# Patient Record
Sex: Female | Born: 1965
Health system: Southern US, Community
[De-identification: ages and names within clinical notes are randomized; demographics above are authoritative.]

## PROBLEM LIST (undated history)

## (undated) DIAGNOSIS — I10 Essential (primary) hypertension: Secondary | ICD-10-CM

## (undated) DIAGNOSIS — K219 Gastro-esophageal reflux disease without esophagitis: Secondary | ICD-10-CM

## (undated) HISTORY — PX: BREAST BIOPSY: SHX20

---

## 2016-09-23 HISTORY — PX: CHOLECYSTECTOMY: SHX55

## 2018-10-19 ENCOUNTER — Emergency Department (HOSPITAL_COMMUNITY)
Admission: EM | Admit: 2018-10-19 | Discharge: 2018-10-19 | Disposition: A | Discharge status: Home / Self Care | Payer: Self-pay | Attending: Emergency Medicine | Admitting: Emergency Medicine

## 2018-10-19 ENCOUNTER — Other Ambulatory Visit: Payer: Self-pay

## 2018-10-19 ENCOUNTER — Encounter (HOSPITAL_COMMUNITY): Payer: Self-pay | Admitting: Emergency Medicine

## 2018-10-19 DIAGNOSIS — I10 Essential (primary) hypertension: Secondary | ICD-10-CM | POA: Insufficient documentation

## 2018-10-19 DIAGNOSIS — N939 Abnormal uterine and vaginal bleeding, unspecified: Secondary | ICD-10-CM | POA: Insufficient documentation

## 2018-10-19 HISTORY — DX: Essential (primary) hypertension: I10

## 2018-10-19 LAB — CBC
HCT: 41.9 % (ref 36.0–46.0)
Hemoglobin: 13.7 g/dL (ref 12.0–15.0)
MCH: 30.2 pg (ref 26.0–34.0)
MCHC: 32.7 g/dL (ref 30.0–36.0)
MCV: 92.3 fL (ref 80.0–100.0)
Platelets: 321 10*3/uL (ref 150–400)
RBC: 4.54 MIL/uL (ref 3.87–5.11)
RDW: 12.6 % (ref 11.5–15.5)
WBC: 8.1 10*3/uL (ref 4.0–10.5)
nRBC: 0 % (ref 0.0–0.2)

## 2018-10-19 LAB — COMPREHENSIVE METABOLIC PANEL
ALT: 17 U/L (ref 0–44)
AST: 21 U/L (ref 15–41)
Albumin: 4 g/dL (ref 3.5–5.0)
Alkaline Phosphatase: 65 U/L (ref 38–126)
Anion gap: 11 (ref 5–15)
BUN: 13 mg/dL (ref 6–20)
CO2: 24 mmol/L (ref 22–32)
Calcium: 9 mg/dL (ref 8.9–10.3)
Chloride: 103 mmol/L (ref 98–111)
Creatinine, Ser: 0.59 mg/dL (ref 0.44–1.00)
GFR calc Af Amer: >60 mL/min (ref >60)
GFR calc non Af Amer: >60 mL/min (ref >60)
Glucose, Bld: 95 mg/dL (ref 70–99)
Potassium: 4.1 mmol/L (ref 3.5–5.1)
Sodium: 138 mmol/L (ref 135–145)
Total Bilirubin: 0.6 mg/dL (ref 0.3–1.2)
Total Protein: 7 g/dL (ref 6.5–8.1)

## 2018-10-19 LAB — I-STAT BETA HCG BLOOD, ED (MC, WL, AP ONLY): I-stat hCG, quantitative: <5 m[IU]/mL (ref <5)

## 2018-10-19 NOTE — ED Triage Notes (Signed)
Pt states she started having vaginal bleeding 1 month ago but not constantly pt is not currently having bleeding but states she has pain in her vaginal. Pt denies any abd pain or n/v.

## 2018-10-19 NOTE — ED Notes (Signed)
Patient verbalizes understanding of discharge instructions. Opportunity for questioning and answers were provided. Pt discharged from ED. 

## 2018-10-20 NOTE — ED Provider Notes (Signed)
[facility] EMERGENCY DEPARTMENT Provider Note   CSN: [id number] Arrival date & time: 10/19/18  1247     History   Chief Complaint Chief Complaint  Patient presents with  . Vaginal Bleeding    HPI Kelly Prince is a 53 y.o. female.     53 year old female with history of hypertension who presents with vaginal bleeding.  Patient states that she has had intermittent problems with vaginal bleeding over the past year.  She does not have constant bleeding, is not currently bleeding but recently has had more problems with vaginal pain and suprapubic discomfort.  No urinary problems, nausea, vomiting, diarrhea, fevers, or vaginal discharge.  SHe last saw an OB/GYN over a year ago.  The history is provided by the patient.  Vaginal Bleeding   Past Medical History:  Diagnosis Date  . Hypertension     There are no active problems to display for this patient.   History reviewed. No pertinent surgical history.   OB History   No obstetric history on file.      Home Medications    Prior to Admission medications   Medication Sig Start Date End Date Taking? Authorizing Provider  lisinopril (ZESTRIL) 20 MG tablet Take 20 mg by mouth daily.   Yes [provider]    Family History No family history on file.  Social History Social History   Tobacco Use  . Smoking status: Not on file  Substance Use Topics  . Alcohol use: Not on file  . Drug use: Not on file     Allergies   Patient has no known allergies.   Review of Systems Review of Systems  Genitourinary: Positive for vaginal bleeding.   All other systems reviewed and are negative except that which was mentioned in HPI   Physical Exam Updated Vital Signs BP 114/70   Pulse 65   Temp 98.4 F (36.9 C) (Oral)   Resp 16   LMP 09/24/2018   SpO2 100%   Physical Exam Vitals signs and nursing note reviewed. Exam conducted with a chaperone present.  Constitutional:      General: She  is not in acute distress.    Appearance: She is well-developed.  HENT:     Head: Normocephalic and atraumatic.  Eyes:     Conjunctiva/sclera: Conjunctivae normal.  Neck:     Musculoskeletal: Neck supple.  Cardiovascular:     Rate and Rhythm: Normal rate and regular rhythm.     Heart sounds: Normal heart sounds. No murmur.  Pulmonary:     Effort: Pulmonary effort is normal.     Breath sounds: Normal breath sounds.  Abdominal:     General: Bowel sounds are normal. There is no distension.     Palpations: Abdomen is soft.     Tenderness: There is no abdominal tenderness.  Genitourinary:    Vagina: No vaginal discharge.     Comments: No blood or discharge in vaginal vault; mass protruding from cervical os, mildly tender on bimanual exam; no adnexal tenderness Skin:    General: Skin is warm and dry.  Neurological:     Mental Status: She is alert and oriented to person, place, and time.     Comments: Fluent speech  Psychiatric:        Judgment: Judgment normal.      ED Treatments / Results  Labs (all labs ordered are listed, but only abnormal results are displayed) Labs Reviewed  COMPREHENSIVE METABOLIC PANEL  CBC  I-STAT BETA HCG  BLOOD, ED ([facility], [facility], [facility] ONLY)  GC/CHLAMYDIA PROBE AMP ([place]) NOT AT [facility]    EKG None  [facility] No results found.  Procedures Procedures (including critical care time)  Medications Ordered in ED Medications - No data to display   Initial Impression / Assessment and Plan / ED Course  I have reviewed the triage vital signs and the nursing notes.  Pertinent labs & imaging results that were available during my care of the patient were reviewed by me and considered in my medical decision making (see chart for details).       On pelvic exam she did not have any bleeding or discharge to suggest infection but she did have a small mass protruding from cervix.  It is possible that this is uterine prolapse although differential includes  tumor.Her lab work today is reassuring with normal hemoglobin.  I have emphasized the importance of close OB/GYN follow-up for further investigation.  Reviewed return precautions.  Patient voiced understanding.  Interpreter used during interview. Final Clinical Impressions(s) / ED Diagnoses   Final diagnoses:  Abnormal uterine bleeding    ED Discharge Orders    None       , Ambrose Finlandachel Morgan, MD 10/20/18 [phone]

## 2018-10-21 LAB — GC/CHLAMYDIA PROBE AMP (~~LOC~~) NOT AT ARMC
Chlamydia: NEGATIVE
Neisseria Gonorrhea: NEGATIVE

## 2018-11-30 ENCOUNTER — Ambulatory Visit (INDEPENDENT_AMBULATORY_CARE_PROVIDER_SITE_OTHER): Payer: Self-pay | Admitting: Obstetrics and Gynecology

## 2018-11-30 ENCOUNTER — Encounter: Payer: Self-pay | Admitting: Obstetrics and Gynecology

## 2018-11-30 ENCOUNTER — Other Ambulatory Visit (HOSPITAL_COMMUNITY)
Admission: RE | Admit: 2018-11-30 | Discharge: 2018-11-30 | Disposition: A | Discharge status: Home / Self Care | Payer: Self-pay | Source: Ambulatory Visit | Attending: Obstetrics and Gynecology | Admitting: Obstetrics and Gynecology

## 2018-11-30 ENCOUNTER — Other Ambulatory Visit: Payer: Self-pay

## 2018-11-30 VITALS — BP 166/83 | HR 75 | Wt 190.8 lb

## 2018-11-30 DIAGNOSIS — Z124 Encounter for screening for malignant neoplasm of cervix: Secondary | ICD-10-CM

## 2018-11-30 DIAGNOSIS — Z113 Encounter for screening for infections with a predominantly sexual mode of transmission: Secondary | ICD-10-CM

## 2018-11-30 DIAGNOSIS — Z789 Other specified health status: Secondary | ICD-10-CM

## 2018-11-30 DIAGNOSIS — N841 Polyp of cervix uteri: Secondary | ICD-10-CM | POA: Insufficient documentation

## 2018-11-30 DIAGNOSIS — Z1151 Encounter for screening for human papillomavirus (HPV): Secondary | ICD-10-CM

## 2018-11-30 DIAGNOSIS — E669 Obesity, unspecified: Secondary | ICD-10-CM | POA: Insufficient documentation

## 2018-11-30 DIAGNOSIS — N950 Postmenopausal bleeding: Secondary | ICD-10-CM | POA: Insufficient documentation

## 2018-11-30 NOTE — Progress Notes (Signed)
Spanish Interpreter Eda R.,  

## 2018-11-30 NOTE — Progress Notes (Signed)
Obstetrics and Gynecology New Patient Evaluation  Appointment Date: 11/30/2018  OBGYN Clinic: [facility] for [facility]  Primary Care Provider: Patient, No Pcp Per  Referring Provider: Emergency Department  Chief Complaint: postmenopausal bleeding  History of Present Illness: Kelly Prince is a 53 y.o. Hispanic [id number] (No LMP recorded.), seen for the above chief complaint.   Patient went to ED on 8/26 for VB. Patient states it started about a week prior and LMP was 7 years prior to this with no bleeding in between. No pain and no bleeding every day  In the ED they noted something on the cervix and she had a negative cmp, cbc, beta hcg and gc/ct  States having some bleeding today. No GI s/s  Review of Systems:  as noted in the History of Present Illness.  Past Medical History:  Past Medical History:  Diagnosis Date  . Hypertension     Past Surgical History:  Past Surgical History:  Procedure Laterality Date  . CHOLECYSTECTOMY      Past Obstetrical History:  OB History  Gravida Para Term Preterm AB Living  2 2 2     2   SAB TAB Ectopic Multiple Live Births               # Outcome Date GA Lbr Len/2nd Weight Sex Delivery Anes PTL Lv  2 Term           1 Term             Obstetric Comments  SVD x 2    Past Gynecological History: As per HPI.  Social History:  Social History   Socioeconomic History  . Marital status: Single    Spouse name: Not on file  . Number of children: Not on file  . Years of education: Not on file  . Highest education level: Not on file  Occupational History  . Not on file  Social Needs  . Financial resource strain: Not on file  . Food insecurity    Worry: Not on file    Inability: Not on file  . Transportation needs    Medical: Not on file    Non-medical: Not on file  Tobacco Use  . Smoking status: Never Smoker  . Smokeless tobacco: Never Used  Substance and Sexual Activity  . Alcohol use: Never    Frequency: Never   . Drug use: Never  . Sexual activity: Not Currently  Lifestyle  . Physical activity    Days per week: Not on file    Minutes per session: Not on file  . Stress: Not on file  Relationships  . Social Herbalist on phone: Not on file    Gets together: Not on file    Attends religious service: Not on file    Active member of club or organization: Not on file    Attends meetings of clubs or organizations: Not on file    Relationship status: Not on file  . Intimate partner violence    Fear of current or ex partner: Not on file    Emotionally abused: Not on file    Physically abused: Not on file    Forced sexual activity: Not on file  Other Topics Concern  . Not on file  Social History Narrative  . Not on file    Family History: No family history on file.   Medications Amaree Loisel had no medications administered during this visit. Current Outpatient Medications  Medication Sig Dispense Refill  .  lisinopril (ZESTRIL) 20 MG tablet Take 20 mg by mouth daily.     No current facility-administered medications for this visit.     Allergies Patient has no known allergies.   Physical Exam:  BP (!) 166/83   Pulse 75   Wt 190 lb 12.8 oz (86.5 kg)  There is no height or weight on file to calculate BMI. General appearance: Well nourished, well developed female in no acute distress.  Respiratory:  Normal respiratory effort Abdomen: positive bowel sounds and no masses, hernias; diffusely non tender to palpation, non distended Neuro/Psych:  Normal mood and affect.  Skin:  Warm and dry.  Lymphatic:  No inguinal lymphadenopathy.   Pelvic exam: is not limited by body habitus EGBUS: within normal limits, mild atrophy Vagina: within normal limits, mild atrophy, and with scant old blood blood in the vault, no discharge Cervix: there is an approximately at least 3cm in length endocervical polyp x 1.5cm in width that extends into the canal (appears to move as if pedunculated),  moderately friable. Surrounding cervix is normal. Uterus:  nonenlarged and non tender and Adnexa:  normal adnexa and no mass, fullness, tenderness Rectovaginal: deferred  See procedure note for pap, embx and polyp biopsy  Laboratory: as per HPI  Radiology: none  Assessment: pt stable  Plan:  I d/w her that the polyp appears benign and is most likely the cause of her bleeding but still recommend standard PMB work up which she is amenable to. U/s will give a better idea on size of polyp and if feasible to remove in the office.   Interpreter used  RTC after u/s  Cornelia Copa MD Attending Center for [facility])

## 2018-12-01 ENCOUNTER — Ambulatory Visit (HOSPITAL_COMMUNITY): Payer: Self-pay

## 2018-12-01 NOTE — Procedures (Addendum)
Pap Smear, Endometrial Biopsy, and Cervical Polyp Biopsy Procedure Note  Pre-operative Diagnosis: PMB  Post-operative Diagnosis: PMB. Endocervical polyp  Procedure Details  The risks (including infection, bleeding, pain, and uterine perforation) and benefits of the procedure were explained to the patient and Written informed consent was obtained.  The patient was placed in the dorsal lithotomy position.  A Graves' speculum inserted in the vagina, and the cervix was visualized. There is an approximately at least 3cm in length endocervical polyp x 1.5cm in width that extends into the canal (appears to move as if pedunculated), moderately friable. Surrounding cervix is normal.  A pap smear was done.    The cervix was then prepped with povidone iodine, and a sharp tenaculum was applied to the anterior lip of the cervix for stabilization.  A pipelle was inserted into the uterine cavity and sounded the uterus only to a depth of 6.5cm in multiple places.  A scant amount of tissue was collected after 2 passes.   Tischler forceps were used to take a biopsy from the polyp and silver nitrate then applied  The samples were sent for pathologic examination.  Condition: Stable  Complications: None  Plan: EMBx done but I don't think I got intrauterine, but I didn't want to try and dilate given the polyp. Follow up u/s and embx results.  She was advised to be on pelvic rest until the polyp is fully dealt with.  Durene Romans MD Attending Center for [employer])

## 2018-12-02 LAB — SURGICAL PATHOLOGY

## 2018-12-07 ENCOUNTER — Ambulatory Visit (HOSPITAL_COMMUNITY)
Admission: RE | Admit: 2018-12-07 | Discharge: 2018-12-07 | Disposition: A | Discharge status: Home / Self Care | Payer: Self-pay | Source: Ambulatory Visit | Attending: Obstetrics and Gynecology | Admitting: Obstetrics and Gynecology

## 2018-12-07 ENCOUNTER — Other Ambulatory Visit: Payer: Self-pay

## 2018-12-07 DIAGNOSIS — N950 Postmenopausal bleeding: Secondary | ICD-10-CM | POA: Insufficient documentation

## 2018-12-12 LAB — CYTOLOGY - PAP
Comment: NEGATIVE
Comment: NEGATIVE
Comment: NEGATIVE
Diagnosis: NEGATIVE
HPV 16: NEGATIVE
HPV 18 / 45: NEGATIVE
High risk HPV: POSITIVE — AB
Trichomonas: NEGATIVE

## 2018-12-15 ENCOUNTER — Telehealth: Payer: Self-pay | Admitting: *Deleted

## 2018-12-15 NOTE — Telephone Encounter (Signed)
Siana called and left a voice message she had an ultrasound 2-3 weeks ago and was supposed to get a call from the doctor. Wants to know the results. I called Bertie and informed her she has a fu appt 01/11/19 and the doctor will discuss in detail. I did inform her it showed thickened endometrium and recommends endometrial biopsy. She states she already had the endometrial biopsy . She wants Dr. Ilda Basset to let her know asap if he is going to be able to remove the polyp in the office or if it will be  Surgery in the hospital  because she is wanting to go back to [place].  I informed her I can forward to Dr. Ilda Basset and we will get back to  Her once we hear from him. She voices understanding. Marri Mcneff,RN

## 2018-12-19 ENCOUNTER — Telehealth: Payer: Self-pay | Admitting: Obstetrics and Gynecology

## 2018-12-19 NOTE — Telephone Encounter (Signed)
GYN Telephone Note Patient called at [phone] and d/w her re: ultrasound findings and recommend removal in the OR given it's size. Pt is moving to [place] but would like to have the surgery here. D/w her re: outpatient nature of it and plan for hysteroscopy, myosure polypectomy with large blade and d&c.   She does have questions re: cost and scheduling can d/w her re: this.  Preferred phone number [phone]  Phone interpreter used  Aletha Halim, Brooke Bonito MD Attending Center for [employer] (Faculty Practice) 12/19/2018 Time: 1249pm

## 2018-12-21 NOTE — Telephone Encounter (Signed)
Per chart review Dr.Pickens did call Alyx and address her questions. ,RN

## 2018-12-23 ENCOUNTER — Encounter: Payer: Self-pay | Admitting: Obstetrics and Gynecology

## 2018-12-23 DIAGNOSIS — B977 Papillomavirus as the cause of diseases classified elsewhere: Secondary | ICD-10-CM | POA: Insufficient documentation

## 2019-01-10 ENCOUNTER — Telehealth: Payer: Self-pay | Admitting: Obstetrics and Gynecology

## 2019-01-10 NOTE — Telephone Encounter (Signed)
Spanish interpreter Eda attempted to call patient about her appointment on 11/18 @ 10:35. No answer, Eda left a voicemail instructing patient to wear a face mask for the entire appointment and no visitors are allowed. Patient instructed not to attend the appointment if she has any symptoms. Symptom list and office number left.

## 2019-01-11 ENCOUNTER — Ambulatory Visit (INDEPENDENT_AMBULATORY_CARE_PROVIDER_SITE_OTHER): Payer: Self-pay | Admitting: Obstetrics and Gynecology

## 2019-01-11 ENCOUNTER — Other Ambulatory Visit: Payer: Self-pay

## 2019-01-11 VITALS — BP 185/81 | HR 61 | Wt 189.3 lb

## 2019-01-11 DIAGNOSIS — I10 Essential (primary) hypertension: Secondary | ICD-10-CM

## 2019-01-11 DIAGNOSIS — N841 Polyp of cervix uteri: Secondary | ICD-10-CM

## 2019-01-11 DIAGNOSIS — Z789 Other specified health status: Secondary | ICD-10-CM

## 2019-01-11 NOTE — Progress Notes (Signed)
Spanish Interpreter Eda R. 

## 2019-01-12 ENCOUNTER — Encounter (HOSPITAL_BASED_OUTPATIENT_CLINIC_OR_DEPARTMENT_OTHER): Payer: Self-pay

## 2019-01-12 ENCOUNTER — Other Ambulatory Visit: Payer: Self-pay | Admitting: Obstetrics and Gynecology

## 2019-01-12 ENCOUNTER — Other Ambulatory Visit: Payer: Self-pay

## 2019-01-12 DIAGNOSIS — I10 Essential (primary) hypertension: Secondary | ICD-10-CM | POA: Insufficient documentation

## 2019-01-12 LAB — COMPREHENSIVE METABOLIC PANEL
ALT: 21 IU/L (ref 0–32)
AST: 24 IU/L (ref 0–40)
Albumin/Globulin Ratio: 2 (ref 1.2–2.2)
Albumin: 4.9 g/dL (ref 3.8–4.9)
Alkaline Phosphatase: 79 IU/L (ref 39–117)
BUN/Creatinine Ratio: 20 (ref 9–23)
BUN: 12 mg/dL (ref 6–24)
Bilirubin Total: 0.6 mg/dL (ref 0.0–1.2)
CO2: 24 mmol/L (ref 20–29)
Calcium: 9.6 mg/dL (ref 8.7–10.2)
Chloride: 101 mmol/L (ref 96–106)
Creatinine, Ser: 0.59 mg/dL (ref 0.57–1.00)
GFR calc Af Amer: 119 mL/min/{1.73_m2} (ref >59)
GFR calc non Af Amer: 104 mL/min/{1.73_m2} (ref >59)
Globulin, Total: 2.5 g/dL (ref 1.5–4.5)
Glucose: 92 mg/dL (ref 65–99)
Potassium: 4.6 mmol/L (ref 3.5–5.2)
Sodium: 140 mmol/L (ref 134–144)
Total Protein: 7.4 g/dL (ref 6.0–8.5)

## 2019-01-12 LAB — CBC
Hematocrit: 41.8 % (ref 34.0–46.6)
Hemoglobin: 14.4 g/dL (ref 11.1–15.9)
MCH: 30.6 pg (ref 26.6–33.0)
MCHC: 34.4 g/dL (ref 31.5–35.7)
MCV: 89 fL (ref 79–97)
Platelets: 319 10*3/uL (ref 150–450)
RBC: 4.7 x10E6/uL (ref 3.77–5.28)
RDW: 12.5 % (ref 11.7–15.4)
WBC: 7.4 10*3/uL (ref 3.4–10.8)

## 2019-01-12 LAB — T4, FREE: Free T4: 1.04 ng/dL (ref 0.82–1.77)

## 2019-01-12 LAB — MAGNESIUM: Magnesium: 2.4 mg/dL — ABNORMAL HIGH (ref 1.6–2.3)

## 2019-01-12 LAB — TSH: TSH: 2.77 u[IU]/mL (ref 0.450–4.500)

## 2019-01-12 NOTE — Progress Notes (Signed)
Obstetrics and Gynecology Visit Return Patient Evaluation  Appointment Date: 01/11/2019  Primary Care Provider: Patient, No Pcp Per  OBGYN Clinic: [facility]  Chief Complaint: f/u cervical polyp  History of Present Illness:  Kelly Prince is a 53 y.o. with above CC.  Patient set up for 11/25 hysteroscopy, d&c and myosure polypectomy.  She states she didn't take her lisinopril this morning. Not having any VB or spotting and no chest pain, sob, ha or other neuro s/s.    Review of Systems: as noted in the History of Present Illness.  Medications:  Mairlyn Tegtmeyer had no medications administered during this visit. Current Outpatient Medications  Medication Sig Dispense Refill  . lisinopril (ZESTRIL) 20 MG tablet Take 20 mg by mouth daily.     No current facility-administered medications for this visit.     Allergies: has No Known Allergies.  Physical Exam:  BP (!) 185/81   Pulse 61   Wt 189 lb 4.8 oz (85.9 kg)   LMP 09/24/2018  There is no height or weight on file to calculate BMI. General appearance: Well nourished, well developed female in no acute distress.  Neuro/Psych:  Normal mood and affect.    Assessment: pt with asymptomatic htn  Plan:  1. Hypertension, unspecified type Pt told to take today and continue qday and RTC for bp check in 2 days. If still really elevated, I told her that will have to talk to anesthesia and may have to cancel surgery; pt states her PCP is in [place] - CBC - Magnesium - TSH - T4, free - CMP  2. Language barrier Interpreter used  3. Cervical polyp See above.    RTC: 2 days for rn bp check  Durene Romans MD Attending Center for [facility])

## 2019-01-13 ENCOUNTER — Other Ambulatory Visit: Payer: Self-pay | Admitting: Obstetrics and Gynecology

## 2019-01-13 ENCOUNTER — Ambulatory Visit (INDEPENDENT_AMBULATORY_CARE_PROVIDER_SITE_OTHER): Payer: Self-pay

## 2019-01-13 ENCOUNTER — Telehealth: Payer: Self-pay

## 2019-01-13 VITALS — BP 178/93 | HR 62 | Wt 190.2 lb

## 2019-01-13 DIAGNOSIS — Z0130 Encounter for examination of blood pressure without abnormal findings: Secondary | ICD-10-CM

## 2019-01-13 NOTE — Telephone Encounter (Signed)
-----   Message from Aletha Halim, MD sent at 01/13/2019 11:26 AM EST ----- Regarding: please let her know that we have to cancel her surgery next week b/c her BPs need to be better controlled by her pcp thanks

## 2019-01-13 NOTE — Telephone Encounter (Signed)
Spoke to Kelly Prince vis interpreter (interpreter ID [phone]), informed her that her surgery has been cancelled and advised her to follow up with a pcp for her blood pressure. Confirmed that she had the phone number for a pcp, she verified, advised her to call us back if she had any questions.

## 2019-01-13 NOTE — Progress Notes (Signed)
Pt here today for BP check following appt on 12/11/18 with Ilda Basset, MD. Pt reported not taking Lisinopril on day of visit and had elevated BP of 185/81. Pt was scheduled for surgery but needed follow up on BP.  Pt states she took Lisinopril this AM. BP today is 178/93. BP reviewed with Ilda Basset, MD who states he will discuss with anesthesia before moving forward with surgery.   Pt notified of conversation with Ilda Basset, MD. Explained to pt that we will contact her to let her know if she will be able to have the surgery or if it will be canceled. Pt states she last saw her PCP in [place], [place] in November of 2019. Pt states she does not plan to return. Information given to pt about Primary Care at [facility] and encouraged to make a new pt appt so that her BP can be managed. Educated pt about need to go to urgent care if she is experiencing any headache, blurry vision, or dizziness and has not begun care with PCP. Pt verbalizes understanding.  Apolonio Schneiders RN 01/13/19

## 2019-01-14 ENCOUNTER — Other Ambulatory Visit (HOSPITAL_COMMUNITY): Payer: Self-pay

## 2019-01-17 NOTE — Progress Notes (Signed)
Patient seen and assessed by nursing staff during this encounter. I have reviewed the chart and agree with the documentation and plan.  Brileigh Sevcik, MD 01/17/2019 12:18 AM    

## 2019-01-18 ENCOUNTER — Ambulatory Visit (HOSPITAL_BASED_OUTPATIENT_CLINIC_OR_DEPARTMENT_OTHER): Admit: 2019-01-18 | Payer: Self-pay | Admitting: Obstetrics and Gynecology

## 2019-01-18 SURGERY — DILATATION & CURETTAGE/HYSTEROSCOPY WITH MYOSURE
Anesthesia: Choice

## 2019-01-23 ENCOUNTER — Telehealth (INDEPENDENT_AMBULATORY_CARE_PROVIDER_SITE_OTHER): Payer: Self-pay | Admitting: Lactation Services

## 2019-01-23 ENCOUNTER — Telehealth: Payer: Self-pay

## 2019-01-23 DIAGNOSIS — B977 Papillomavirus as the cause of diseases classified elsewhere: Secondary | ICD-10-CM

## 2019-01-23 NOTE — Telephone Encounter (Signed)
-----   Message from Aletha Halim, MD sent at 01/23/2019  8:52 AM EST ----- Can you let her know that she'll need a pap smear in one year? thanks

## 2019-01-23 NOTE — Telephone Encounter (Signed)
Called to speak with pt with assistance of [employer] # [phone].   Lm on patients home phone that office is calling with results and to call the office back at [phone].   Called pt on the mobile phone # in the chart and spoke with [facility]. Results of + HPV and biopsy given with recommendation to follow up with a repeat Pap Smear in 1 year.   Pt with multiple questions about HPV that were answered. Discussed it is a common virus and the infection often clears on its own.

## 2019-01-23 NOTE — Telephone Encounter (Signed)
Called patient to do their pre-visit COVID screening. Number listed is daughter  Have you tested positive for COVID or are you currently waiting for COVID test results? no  Have you recently traveled internationally([place], [place], [place], [place], [place]) or within the [place] to a hotspot area([place], [place], [place], [place], [place])? no  Are you currently experiencing any of the following symptoms: fever, cough, SHOB, fatigue, body aches, loss of smell/taste, rash, diarrhea, vomiting, severe headaches, weakness, sore throat? no  Have you been in contact with anyone who has recently travelled? no  Have you been in contact with anyone who is experiencing any of the above symptoms or been diagnosed with COVID  or works in or has recently visited a SNF? no

## 2019-01-24 ENCOUNTER — Ambulatory Visit (INDEPENDENT_AMBULATORY_CARE_PROVIDER_SITE_OTHER): Payer: Self-pay | Admitting: Internal Medicine

## 2019-01-24 ENCOUNTER — Other Ambulatory Visit: Payer: Self-pay

## 2019-01-24 VITALS — BP 144/88 | HR 62 | Temp 98.2°F | Resp 17 | Ht 65.0 in | Wt 189.4 lb

## 2019-01-24 DIAGNOSIS — I10 Essential (primary) hypertension: Secondary | ICD-10-CM

## 2019-01-24 DIAGNOSIS — Z01818 Encounter for other preprocedural examination: Secondary | ICD-10-CM

## 2019-01-24 MED ORDER — AMLODIPINE BESYLATE 5 MG PO TABS: 5.0000 mg | ORAL_TABLET | Freq: Every day | ORAL | 3 refills | Status: AC

## 2019-01-24 MED ORDER — LISINOPRIL 20 MG PO TABS: 20.0000 mg | ORAL_TABLET | Freq: Every day | ORAL | 3 refills | Status: AC

## 2019-01-24 MED FILL — LISINOPRIL 20 MG TABLET: 20 | 30 days supply | Qty: 30 | Fill #0

## 2019-01-24 MED FILL — AMLODIPINE BESYLATE 5 MG TA: 5 | 30 days supply | Qty: 30 | Fill #0

## 2019-01-24 NOTE — Progress Notes (Signed)
Patient ID: Kelly RadarMarisol Prince, female    DOB: [birth year]  MRN: [id number]  CC: Establish Care and Hypertension   Subjective: Kelly Prince is a 53 y.o. female who presents for new patient visit and management of hypertension.  Interpreter, Wallene HuhMarta, interpreter from [place], is with her.  Her concerns today include:  Patient with history of HTN, obesity, hearing impaired on RT (hearing aid)  [place] relocated from [place] 3.5 mths.  Last saw doctor there 1 yr ago.   Pt was scheduled to have hysteroscopy, D&C and MyoSure polypectomy by gynecology on 01/18/2019.  However surgery was canceled due to elevated blood pressure.  Patient advised to be seen by a primary care physician to address prior to surgery.  Patient diagnosed with HTN 7 years ago - Currently on Lisinopril 20 mg x 7 yrs.  No device to check BP Endorses some HA and little dizziness when she looks down at her cell phone.  She has noticed this for the past 5 days.  Denies any lower extremity edema.  She tries to limit salt in the foods.  -She has had gallbladder surgery in the past.  No problems waking up from anesthesia.  Past medical, surgical, family history and social histories reviewed and updated.  Patient Active Problem List   Diagnosis Date Noted  . Hypertension 01/12/2019  . 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 Obesity 11/30/2018  . Postmenopausal bleeding 11/30/2018  . Language barrier 11/30/2018  . Cervical polyp 11/30/2018     No current outpatient medications on file prior to visit.   No current facility-administered medications on file prior to visit.     No Known Allergies  Social History   Socioeconomic History  . Marital status: Single    Spouse name: Not on file  . Number of children: 2  . Years of education: BA -accounting  . Highest education level: Not on file  Occupational History  . Occupation: unemployed  Social Needs  . Financial resource strain: Not on file  . Food insecurity    Worry: Not on file     Inability: Not on file  . Transportation needs    Medical: Not on file    Non-medical: Not on file  Tobacco Use  . Smoking status: Never Smoker  . Smokeless tobacco: Never Used  Substance and Sexual Activity  . Alcohol use: Never    Frequency: Never  . Drug use: Never  . Sexual activity: Not Currently  Lifestyle  . Physical activity    Days per week: Not on file    Minutes per session: Not on file  . Stress: Not on file  Relationships  . Social Musicianconnections    Talks on phone: Not on file    Gets together: Not on file    Attends religious service: Not on file    Active member of club or organization: Not on file    Attends meetings of clubs or organizations: Not on file    Relationship status: Not on file  . Intimate partner violence    Fear of current or ex partner: Not on file    Emotionally abused: Not on file    Physically abused: Not on file    Forced sexual activity: Not on file  Other Topics Concern  . Not on file  Social History Narrative  . Not on file    Family History  Problem Relation Age of Onset  . Hypertension Mother   . Thyroid disease Mother  Past Surgical History:  Procedure Laterality Date  . BREAST BIOPSY Right   . CHOLECYSTECTOMY  09/2016    ROS: Review of Systems Negative except as stated above  PHYSICAL EXAM: BP (!) 165/84   Pulse 62   Temp 98.2 F (36.8 C) (Temporal)   Resp 17   Ht 5\' 5"  (1.651 m)   Wt 189 lb 6.4 oz (85.9 kg)   LMP 09/24/2018   SpO2 99%   BMI 31.52 kg/m   Physical Exam Repeat blood pressure 144/88 General appearance - alert, well appearing, middle-age Hispanic female and in no distress Mental status - normal mood, behavior, speech, dress, motor activity, and thought processes Eyes - pupils equal and reactive, extraocular eye movements intact Nose - normal and patent, no erythema, discharge or polyps Mouth - mucous membranes moist, pharynx normal without lesions Neck - supple, no significant  adenopathy.  No cervical lymphadenopathy.  No thyroid enlargement or nodules. Lymphatics -no axillary lymphadenopathy  chest - clear to auscultation, no wheezes, rales or rhonchi, symmetric air entry Heart - normal rate, regular rhythm, normal S1, S2, no murmurs, rubs, clicks or gallops Extremities - peripheral pulses normal, no pedal edema, no clubbing or cyanosis   CMP Latest Ref Rng & Units 01/11/2019 10/19/2018  Glucose 65 - 99 mg/dL 92 95  BUN 6 - 24 mg/dL 12 13  Creatinine 10/21/2018 - 1.00 mg/dL 5.63 8.75  Sodium 6.43 - 144 mmol/L 140 138  Potassium 3.5 - 5.2 mmol/L 4.6 4.1  Chloride 96 - 106 mmol/L 101 103  CO2 20 - 29 mmol/L 24 24  Calcium 8.7 - 10.2 mg/dL 9.6 9.0  Total Protein 6.0 - 8.5 g/dL 7.4 7.0  Total Bilirubin 0.0 - 1.2 mg/dL 0.6 0.6  Alkaline Phos 39 - 117 IU/L 79 65  AST 0 - 40 IU/L 24 21  ALT 0 - 32 IU/L 21 17   Lipid Panel  No results found for: CHOL, TRIG, HDL, CHOLHDL, VLDL, LDLCALC, LDLDIRECT  CBC    Component Value Date/Time   WBC 7.4 01/11/2019 1051   WBC 8.1 10/19/2018 1320   RBC 4.70 01/11/2019 1051   RBC 4.54 10/19/2018 1320   HGB 14.4 01/11/2019 1051   HCT 41.8 01/11/2019 1051   PLT 319 01/11/2019 1051   MCV 89 01/11/2019 1051   MCH 30.6 01/11/2019 1051   MCH 30.2 10/19/2018 1320   MCHC 34.4 01/11/2019 1051   MCHC 32.7 10/19/2018 1320   RDW 12.5 01/11/2019 1051    ASSESSMENT AND PLAN: 1. Essential hypertension Blood pressure not at goal.  Goal being 130/80 or lower.  She will continue lisinopril.  We have added a low-dose of amlodipine that she will take once a day.  DASH diet discussed and encouraged.  Nurse only blood pressure check in 1 week.  If blood pressure is okay I will send a message to her gynecologist letting them know that it is okay to proceed - amLODipine (NORVASC) 5 MG tablet; Take 1 tablet (5 mg total) by mouth daily.  Dispense: 30 tablet; Refill: 3 - lisinopril (ZESTRIL) 20 MG tablet; Take 1 tablet (20 mg total) by mouth daily.   Dispense: 30 tablet; Refill: 3  2. Preoperative examination See #1 above      Patient was given the opportunity to ask questions.  Patient verbalized understanding of the plan and was able to repeat key elements of the plan.   No orders of the defined types were placed in this encounter.    Requested  Prescriptions   Signed Prescriptions Disp Refills  . amLODipine (NORVASC) 5 MG tablet 30 tablet 3    Sig: Take 1 tablet (5 mg total) by mouth daily.  Marland Kitchen lisinopril (ZESTRIL) 20 MG tablet 30 tablet 3    Sig: Take 1 tablet (20 mg total) by mouth daily.    Return in about 2 months (around 03/27/2019).  Karle Plumber, MD, FACP

## 2019-01-24 NOTE — Progress Notes (Signed)
New patient appointment. Previous PCP in [place], [place].  Referred by OBGYN due to elevated BP. Had surgery scheduled to have hysteroscopy, D&C and myosure polypectomy done but it was cancelled due to her BP being elevated.  Has taking BP medication.  Doesn't check BP at home. Denies chest pain, SHOB, headaches, palpitations, dizziness, lower extremity swelling.

## 2019-01-24 NOTE — Patient Instructions (Signed)
Hipertensin en los adultos Hypertension, Adult La presin arterial alta (hipertensin) se produce cuando la fuerza de la sangre bombea a travs de las arterias con mucha fuerza. Las arterias son los vasos sanguneos que transportan la sangre desde el corazn al resto del cuerpo. La hipertensin hace que el corazn haga ms esfuerzo para bombear sangre y puede provocar que las arterias se estrechen o endurezcan. La hipertensin no tratada o no controlada puede causar infarto de miocardio, insuficiencia cardaca, accidente cerebrovascular, enfermedad renal y otros problemas. Una lectura de la presin arterial consta de un nmero ms alto sobre un nmero ms bajo. En condiciones ideales, la presin arterial debe estar por debajo de 120/80. El primer nmero ("superior") es la presin sistlica. Es la medida de la presin de las arterias cuando el corazn late. El segundo nmero ("inferior") es la presin diastlica. Es la medida de la presin en las arterias cuando el corazn se relaja. Cules son las causas? Se desconoce la causa exacta de esta afeccin. Hay algunas afecciones que causan presin arterial alta o estn relacionadas con ella. Qu incrementa el riesgo? Algunos factores de riesgo de hipertensin estn bajo su control. Los siguientes factores pueden hacer que sea ms propenso a desarrollar esta afeccin:  Fumar.  Tener diabetes mellitus tipo 2, colesterol alto, o ambos.  No hacer la cantidad suficiente de actividad fsica o ejercicio.  Tener sobrepeso.  Consumir mucha grasa, azcar, caloras o sal (sodio) en su dieta.  Beber alcohol en exceso. Algunos factores de riesgo para la presin arterial alta pueden ser difciles o imposibles de cambiar. Algunos de estos factores son los siguientes:  Tener enfermedad renal crnica.  Tener antecedentes familiares de presin arterial alta.  Edad. Los riesgos aumentan con la edad.  Raza. El riesgo es mayor para las personas afroamericanas.   Sexo. Antes de los 45aos, los hombres corren ms riesgo que las mujeres. Despus de los 65aos, las mujeres corren ms riesgo que los hombres.  Tener apnea obstructiva del sueo.  Estrs. Cules son los signos o los sntomas? Es posible que la presin arterial alta puede no cause sntomas. La presin arterial muy alta (crisis hipertensiva) puede provocar:  Dolor de cabeza.  Ansiedad.  Falta de aire.  Hemorragia nasal.  Nuseas y vmitos.  Cambios en la visin.  Dolor de pecho intenso.  Convulsiones. Cmo se diagnostica? Esta afeccin se diagnostica al medir su presin arterial mientras se encuentra sentado, con el brazo apoyado sobre una superficie plana, las piernas sin cruzar y los pies bien apoyados en el piso. El brazalete del tensimetro debe colocarse directamente sobre la piel de la parte superior del brazo y al nivel de su corazn. Debe medirla al menos dos veces en el mismo brazo. Determinadas condiciones pueden causar una diferencia de presin arterial entre el brazo izquierdo y el derecho. Ciertos factores pueden provocar que las lecturas de la presin arterial sean inferiores o superiores a lo normal por un perodo corto de tiempo:  Si su presin arterial es ms alta cuando se encuentra en el consultorio del mdico que cuando la mide en su hogar, se denomina "hipertensin de bata blanca". La mayora de las personas que tienen esta afeccin no deben ser medicadas.  Si su presin arterial es ms alta en el hogar que cuando se encuentra en el consultorio del mdico, se denomina "hipertensin enmascarada". La mayora de las personas que tienen esta afeccin deben ser medicadas para controlar la presin arterial. Si tiene una lecturas de presin arterial alta durante   una visita o si tiene presin arterial normal con otros factores de riesgo, se le podr pedir que haga lo siguiente:  Que regrese otro da para volver a controlar su presin arterial nuevamente.  Que se  controle la presin arterial en su casa durante 1 semana o ms. Si se le diagnostica hipertensin, es posible que se le realicen otros anlisis de sangre o estudios de diagnstico por imgenes para ayudar a su mdico a comprender su riesgo general de tener otras afecciones. Cmo se trata? Esta afeccin se trata haciendo cambios saludables en el estilo de vida, tales como ingerir alimentos saludables, realizar ms ejercicio y reducir el consumo de alcohol. El mdico puede recetarle medicamentos si los cambios en el estilo de vida no son suficientes para lograr controlar la presin arterial y si:  Su presin arterial sistlica est por encima de 130.  Su presin arterial diastlica est por encima de 80. La presin arterial deseada puede variar en funcin de las enfermedades, la edad y otros factores personales. Siga estas instrucciones en su casa: Comida y bebida   Siga una dieta con alto contenido de fibras y potasio, y con bajo contenido de sodio, azcar agregada y grasas. Un ejemplo de plan alimenticio es la dieta DASH (Dietary Approaches to Stop Hypertension, Mtodos alimenticios para detener la hipertensin). Para alimentarse de esta manera: ? Coma mucha fruta y verdura fresca. Trate de que la mitad del plato de cada comida sea de frutas y verduras. ? Coma cereales integrales, como pasta integral, arroz integral o pan integral. Llene aproximadamente un cuarto del plato con cereales integrales. ? Coma y beba productos lcteos con bajo contenido de grasa, como leche descremada o yogur bajo en grasas. ? Evite la ingesta de cortes de carne grasa, carne procesada o curada, y carne de ave con piel. Llene aproximadamente un cuarto del plato con protenas magras, como pescado, pollo sin piel, frijoles, huevos o tofu. ? Evite ingerir alimentos prehechos y procesados. En general, estos tienen mayor cantidad de sodio, azcar agregada y grasa.  Reduzca su ingesta diaria de sodio. La mayora de las  personas que tienen hipertensin deben comer menos de 1500 mg de sodio por da.  No beba alcohol si: ? Su mdico le indica no hacerlo. ? Est embarazada, puede estar embarazada o est tratando de quedar embarazada.  Si bebe alcohol: ? Limite la cantidad que bebe a lo siguiente:  De 0 a 1 medida por da para las mujeres.  De 0 a 2 medidas por da para los hombres. ? Est atento a la cantidad de alcohol que hay en las bebidas que toma. En los [place], una medida equivale a una botella de cerveza de 12oz (355ml), un vaso de vino de 5oz (148ml) o un vaso de una bebida alcohlica de alta graduacin de 1oz (44ml). Estilo de vida   Trabaje con su mdico para mantener un peso saludable o perder peso. Pregntele cul es el peso recomendado para usted.  Haga al menos 30minutos de ejercicio la mayora de los das de la semana. Estas actividades pueden incluir caminar, nadar o andar en bicicleta.  Incluya ejercicios para fortalecer sus msculos (ejercicios de resistencia), como Pilates o levantamiento de pesas, como parte de su rutina semanal de ejercicios. Intente realizar 30minutos de este tipo de ejercicios al menos tres das a la semana.  No consuma ningn producto que contenga nicotina o tabaco, como cigarrillos, cigarrillos electrnicos y tabaco de mascar. Si necesita ayuda para dejar de fumar, consulte al   mdico.  Contrlese la presin arterial en su casa segn las indicaciones del mdico.  Concurra a todas las visitas de seguimiento como se lo haya indicado el mdico. Esto es importante. Medicamentos  Tome los medicamentos de venta libre y los recetados solamente como se lo haya indicado el mdico. Siga cuidadosamente las indicaciones. Los medicamentos para la presin arterial deben tomarse segn las indicaciones.  No omita las dosis de medicamentos para la presin arterial. Si lo hace, estar en riesgo de tener problemas y puede hacer que los medicamentos sean menos  eficaces.  Pregntele a su mdico a qu efectos secundarios o reacciones a los medicamentos debe prestar atencin. Comunquese con un mdico si:  Piensa que tiene una reaccin a un medicamento que est tomando.  Tiene dolores de cabeza frecuentes (recurrentes).  Se siente mareado.  Tiene hinchazn en los tobillos.  Tiene problemas de visin. Solicite ayuda inmediatamente si:  Siente un dolor de cabeza intenso o confusin.  Siente debilidad inusual o adormecimiento.  Siente que va a desmayarse.  Siente un dolor intenso en el pecho o el abdomen.  Vomita repetidas veces.  Tiene dificultad para respirar. Resumen  La hipertensin se produce cuando la sangre bombea en las arterias con mucha fuerza. Si esta afeccin no se controla, podra correr riesgo de tener complicaciones graves.  La presin arterial deseada puede variar en funcin de las enfermedades, la edad y otros factores personales. Para la mayora de las personas, una presin arterial normal es menor que 120/80.  La hipertensin se trata con cambios en el estilo de vida, medicamentos o una combinacin de ambos. Los cambios en el estilo de vida incluyen prdida de peso, ingerir alimentos sanos, seguir una dieta baja en sodio, hacer ms ejercicio y limitar el consumo de alcohol. Esta informacin no tiene como fin reemplazar el consejo del mdico. Asegrese de hacerle al mdico cualquier pregunta que tenga. Document Released: 02/09/2005 Document Revised: 11/25/2017 Document Reviewed: 11/25/2017 Elsevier Patient Education  2020 Elsevier Inc.  

## 2019-01-30 ENCOUNTER — Ambulatory Visit (INDEPENDENT_AMBULATORY_CARE_PROVIDER_SITE_OTHER): Payer: Self-pay

## 2019-01-30 ENCOUNTER — Other Ambulatory Visit: Payer: Self-pay

## 2019-01-30 VITALS — BP 133/80 | HR 77 | Temp 97.3°F

## 2019-01-30 DIAGNOSIS — I10 Essential (primary) hypertension: Secondary | ICD-10-CM

## 2019-01-30 NOTE — Progress Notes (Signed)
Patient here for BP check. Patient has taken BP medications this morning. After letting patient sit for 10 minutes BP was 133/80, pulse was 77. KWalker, CMA.

## 2019-02-09 ENCOUNTER — Telehealth: Payer: Self-pay | Admitting: Lactation Services

## 2019-02-09 NOTE — Telephone Encounter (Signed)
Patiens daughter called on her mom's behalf. Daughter reports patient was seen by [facility] and that [facility] sent information back on BP status. Pt would like to know if her surgery is going to be rescheduled.

## 2019-02-14 NOTE — Telephone Encounter (Signed)
Called pt with assistance of [employer], Administrator, sports. Pt was informed that we are awaiting rescheduling surgery based on OR time availability. Pt informed she will be called once she is scheduled for her surgery. Pt reports she has no questions or concerns at this time.

## 2019-02-14 NOTE — Telephone Encounter (Signed)
Can you let her know that we are checking with the OR to see when we can get it scheduled. It may be sometime because the ORs are a little tight due to COVID and hospital capacity. thanks

## 2019-03-08 ENCOUNTER — Other Ambulatory Visit: Payer: Self-pay | Admitting: Obstetrics and Gynecology

## 2019-03-09 ENCOUNTER — Encounter (HOSPITAL_BASED_OUTPATIENT_CLINIC_OR_DEPARTMENT_OTHER): Payer: Self-pay | Admitting: Obstetrics and Gynecology

## 2019-03-09 ENCOUNTER — Other Ambulatory Visit: Payer: Self-pay

## 2019-03-11 ENCOUNTER — Other Ambulatory Visit (HOSPITAL_COMMUNITY)
Admission: RE | Admit: 2019-03-11 | Discharge: 2019-03-11 | Disposition: A | Discharge status: Home / Self Care | Payer: Self-pay | Source: Ambulatory Visit | Attending: Obstetrics and Gynecology | Admitting: Obstetrics and Gynecology

## 2019-03-11 DIAGNOSIS — Z20822 Contact with and (suspected) exposure to covid-19: Secondary | ICD-10-CM | POA: Insufficient documentation

## 2019-03-11 DIAGNOSIS — Z01812 Encounter for preprocedural laboratory examination: Secondary | ICD-10-CM | POA: Insufficient documentation

## 2019-03-12 LAB — NOVEL CORONAVIRUS, NAA (HOSP ORDER, SEND-OUT TO REF LAB; TAT 18-24 HRS): SARS-CoV-2, NAA: NOT DETECTED

## 2019-03-14 ENCOUNTER — Other Ambulatory Visit: Payer: Self-pay

## 2019-03-14 ENCOUNTER — Encounter (HOSPITAL_BASED_OUTPATIENT_CLINIC_OR_DEPARTMENT_OTHER)
Admission: RE | Admit: 2019-03-14 | Discharge: 2019-03-14 | Disposition: A | Discharge status: Home / Self Care | Payer: Self-pay | Source: Ambulatory Visit | Attending: Obstetrics and Gynecology | Admitting: Obstetrics and Gynecology

## 2019-03-14 DIAGNOSIS — Z01810 Encounter for preprocedural cardiovascular examination: Secondary | ICD-10-CM | POA: Insufficient documentation

## 2019-03-14 NOTE — Progress Notes (Signed)

## 2019-03-15 ENCOUNTER — Other Ambulatory Visit: Payer: Self-pay

## 2019-03-15 ENCOUNTER — Ambulatory Visit (HOSPITAL_BASED_OUTPATIENT_CLINIC_OR_DEPARTMENT_OTHER): Payer: Self-pay | Admitting: Anesthesiology

## 2019-03-15 ENCOUNTER — Encounter (HOSPITAL_BASED_OUTPATIENT_CLINIC_OR_DEPARTMENT_OTHER): Payer: Self-pay | Admitting: Obstetrics and Gynecology

## 2019-03-15 ENCOUNTER — Ambulatory Visit (HOSPITAL_BASED_OUTPATIENT_CLINIC_OR_DEPARTMENT_OTHER)
Admission: RE | Admit: 2019-03-15 | Discharge: 2019-03-15 | Disposition: A | Discharge status: Home / Self Care | Payer: Self-pay | Attending: Obstetrics and Gynecology | Admitting: Obstetrics and Gynecology

## 2019-03-15 ENCOUNTER — Encounter (HOSPITAL_BASED_OUTPATIENT_CLINIC_OR_DEPARTMENT_OTHER)
Admission: RE | Disposition: A | Discharge status: Home / Self Care | Payer: Self-pay | Source: Home / Self Care | Attending: Obstetrics and Gynecology

## 2019-03-15 DIAGNOSIS — I10 Essential (primary) hypertension: Secondary | ICD-10-CM | POA: Insufficient documentation

## 2019-03-15 DIAGNOSIS — N950 Postmenopausal bleeding: Secondary | ICD-10-CM | POA: Insufficient documentation

## 2019-03-15 DIAGNOSIS — Z8249 Family history of ischemic heart disease and other diseases of the circulatory system: Secondary | ICD-10-CM | POA: Insufficient documentation

## 2019-03-15 DIAGNOSIS — Z79899 Other long term (current) drug therapy: Secondary | ICD-10-CM | POA: Insufficient documentation

## 2019-03-15 DIAGNOSIS — Z9889 Other specified postprocedural states: Secondary | ICD-10-CM

## 2019-03-15 DIAGNOSIS — R9389 Abnormal findings on diagnostic imaging of other specified body structures: Secondary | ICD-10-CM | POA: Insufficient documentation

## 2019-03-15 DIAGNOSIS — N841 Polyp of cervix uteri: Secondary | ICD-10-CM

## 2019-03-15 DIAGNOSIS — N840 Polyp of corpus uteri: Secondary | ICD-10-CM | POA: Insufficient documentation

## 2019-03-15 DIAGNOSIS — Z8349 Family history of other endocrine, nutritional and metabolic diseases: Secondary | ICD-10-CM | POA: Insufficient documentation

## 2019-03-15 DIAGNOSIS — K219 Gastro-esophageal reflux disease without esophagitis: Secondary | ICD-10-CM | POA: Insufficient documentation

## 2019-03-15 HISTORY — DX: Gastro-esophageal reflux disease without esophagitis: K21.9

## 2019-03-15 HISTORY — PX: DILATATION & CURETTAGE/HYSTEROSCOPY WITH MYOSURE: SHX6511

## 2019-03-15 SURGERY — DILATATION & CURETTAGE/HYSTEROSCOPY WITH MYOSURE
Anesthesia: General | Site: Vagina

## 2019-03-15 MED ORDER — LIDOCAINE HCL 1 % IJ SOLN
INTRAMUSCULAR | Status: DC | PRN
  Administered 2019-03-15: 20 mL

## 2019-03-15 MED ORDER — ACETAMINOPHEN 500 MG PO TABS
ORAL_TABLET | ORAL | Status: AC
  Filled 2019-03-15: qty 2

## 2019-03-15 MED ORDER — KETOROLAC TROMETHAMINE 30 MG/ML IJ SOLN
INTRAMUSCULAR | Status: AC
  Filled 2019-03-15: qty 1

## 2019-03-15 MED ORDER — EPHEDRINE SULFATE-NACL 50-0.9 MG/10ML-% IV SOSY
PREFILLED_SYRINGE | INTRAVENOUS | Status: DC | PRN
  Administered 2019-03-15: 10 mg via INTRAVENOUS

## 2019-03-15 MED ORDER — OXYCODONE-ACETAMINOPHEN 5-325 MG PO TABS: 1.0000 | ORAL_TABLET | Freq: Four times a day (QID) | ORAL | 0 refills | Status: DC | PRN

## 2019-03-15 MED ORDER — MIDAZOLAM HCL 2 MG/2ML IJ SOLN
INTRAMUSCULAR | Status: AC
  Filled 2019-03-15: qty 2

## 2019-03-15 MED ORDER — FENTANYL CITRATE (PF) 100 MCG/2ML IJ SOLN
INTRAMUSCULAR | Status: AC
  Filled 2019-03-15: qty 2

## 2019-03-15 MED ORDER — ACETAMINOPHEN 500 MG PO TABS
1000.0000 mg | ORAL_TABLET | Freq: Once | ORAL | Status: AC
  Administered 2019-03-15: 09:00:00 1000 mg via ORAL

## 2019-03-15 MED ORDER — LACTATED RINGERS IV SOLN: INTRAVENOUS | Status: DC

## 2019-03-15 MED ORDER — FENTANYL CITRATE (PF) 100 MCG/2ML IJ SOLN: 25.0000 ug | INTRAMUSCULAR | Status: DC | PRN

## 2019-03-15 MED ORDER — DOCUSATE SODIUM 100 MG PO CAPS: 100.0000 mg | ORAL_CAPSULE | Freq: Two times a day (BID) | ORAL | 2 refills | Status: AC

## 2019-03-15 MED ORDER — SODIUM CHLORIDE 0.9 % IR SOLN
Status: DC | PRN
  Administered 2019-03-15: 3000 mL

## 2019-03-15 MED ORDER — ONDANSETRON HCL 4 MG/2ML IJ SOLN
INTRAMUSCULAR | Status: AC
  Filled 2019-03-15: qty 2

## 2019-03-15 MED ORDER — OXYCODONE HCL 5 MG/5ML PO SOLN: 5.0000 mg | Freq: Once | ORAL | Status: DC | PRN

## 2019-03-15 MED ORDER — SILVER NITRATE-POT NITRATE 75-25 % EX MISC
CUTANEOUS | Status: AC
  Filled 2019-03-15: qty 10

## 2019-03-15 MED ORDER — MIDAZOLAM HCL 2 MG/2ML IJ SOLN: 1.0000 mg | INTRAMUSCULAR | Status: DC | PRN

## 2019-03-15 MED ORDER — PROPOFOL 10 MG/ML IV BOLUS
INTRAVENOUS | Status: AC
  Filled 2019-03-15: qty 20

## 2019-03-15 MED ORDER — DEXAMETHASONE SODIUM PHOSPHATE 10 MG/ML IJ SOLN
INTRAMUSCULAR | Status: DC | PRN
  Administered 2019-03-15: 4 mg via INTRAVENOUS

## 2019-03-15 MED ORDER — FENTANYL CITRATE (PF) 100 MCG/2ML IJ SOLN: 50.0000 ug | INTRAMUSCULAR | Status: DC | PRN

## 2019-03-15 MED ORDER — KETOROLAC TROMETHAMINE 30 MG/ML IJ SOLN
INTRAMUSCULAR | Status: DC | PRN
  Administered 2019-03-15: 30 mg via INTRAVENOUS

## 2019-03-15 MED ORDER — LIDOCAINE 2% (20 MG/ML) 5 ML SYRINGE
INTRAMUSCULAR | Status: DC | PRN
  Administered 2019-03-15: 60 mg via INTRAVENOUS
  Administered 2019-03-15: 40 mg via INTRAVENOUS

## 2019-03-15 MED ORDER — OXYCODONE HCL 5 MG PO TABS: 5.0000 mg | ORAL_TABLET | Freq: Once | ORAL | Status: DC | PRN

## 2019-03-15 MED ORDER — ONDANSETRON HCL 4 MG/2ML IJ SOLN
INTRAMUSCULAR | Status: DC | PRN
  Administered 2019-03-15: 4 mg via INTRAVENOUS

## 2019-03-15 MED ORDER — PROPOFOL 10 MG/ML IV BOLUS
INTRAVENOUS | Status: DC | PRN
  Administered 2019-03-15: 160 mg via INTRAVENOUS

## 2019-03-15 MED ORDER — FENTANYL CITRATE (PF) 100 MCG/2ML IJ SOLN
INTRAMUSCULAR | Status: DC | PRN
  Administered 2019-03-15 (×2): 50 ug via INTRAVENOUS

## 2019-03-15 MED ORDER — LIDOCAINE HCL (PF) 1 % IJ SOLN
INTRAMUSCULAR | Status: AC
  Filled 2019-03-15: qty 30

## 2019-03-15 MED ORDER — PROMETHAZINE HCL 25 MG/ML IJ SOLN: 6.2500 mg | INTRAMUSCULAR | Status: DC | PRN

## 2019-03-15 MED ORDER — LIDOCAINE 2% (20 MG/ML) 5 ML SYRINGE
INTRAMUSCULAR | Status: AC
  Filled 2019-03-15: qty 5

## 2019-03-15 MED ORDER — MIDAZOLAM HCL 5 MG/5ML IJ SOLN
INTRAMUSCULAR | Status: DC | PRN
  Administered 2019-03-15: 2 mg via INTRAVENOUS

## 2019-03-15 MED ORDER — DEXAMETHASONE SODIUM PHOSPHATE 10 MG/ML IJ SOLN
INTRAMUSCULAR | Status: AC
  Filled 2019-03-15: qty 1

## 2019-03-15 SURGICAL SUPPLY — 23 items
CATH ROBINSON RED A/P 16FR (CATHETERS) ×3 IMPLANT
DEVICE MYOSURE LITE (MISCELLANEOUS) IMPLANT
DEVICE MYOSURE REACH (MISCELLANEOUS) IMPLANT
DILATOR CANAL MILEX (MISCELLANEOUS) IMPLANT
GAUZE 4X4 16PLY RFD (DISPOSABLE) ×3 IMPLANT
GLOVE BIOGEL PI IND STRL 7.0 (GLOVE) ×1 IMPLANT
GLOVE BIOGEL PI IND STRL 7.5 (GLOVE) ×1 IMPLANT
GLOVE BIOGEL PI INDICATOR 7.0 (GLOVE) ×2
GLOVE BIOGEL PI INDICATOR 7.5 (GLOVE) ×2
GLOVE SURG SS PI 7.0 STRL IVOR (GLOVE) ×3 IMPLANT
GOWN STRL REUS W/TWL LRG LVL3 (GOWN DISPOSABLE) ×3 IMPLANT
GOWN STRL REUS W/TWL XL LVL3 (GOWN DISPOSABLE) ×3 IMPLANT
KIT PROCEDURE FLUENT (KITS) ×3 IMPLANT
MYOSURE XL FIBROID (MISCELLANEOUS) ×3
PACK VAGINAL MINOR WOMEN LF (CUSTOM PROCEDURE TRAY) ×3 IMPLANT
PAD OB MATERNITY 4.3X12.25 (PERSONAL CARE ITEMS) ×3 IMPLANT
PAD PREP 24X48 CUFFED NSTRL (MISCELLANEOUS) ×3 IMPLANT
SEAL ROD LENS SCOPE MYOSURE (ABLATOR) ×3 IMPLANT
SLEEVE SCD COMPRESS KNEE MED (MISCELLANEOUS) ×6 IMPLANT
SUT VIC AB 2-0 SH 27 (SUTURE)
SUT VIC AB 2-0 SH 27XBRD (SUTURE) IMPLANT
SYSTEM TISS REMOVAL MYOSURE XL (MISCELLANEOUS) IMPLANT
TOWEL GREEN STERILE FF (TOWEL DISPOSABLE) ×6 IMPLANT

## 2019-03-15 NOTE — Op Note (Signed)
Operative Note   03/15/2019  PRE-OP DIAGNOSIS: Large uterine and endocervical polyp   POST-OP DIAGNOSIS: Same  SURGEON: Surgeon(s) and Role:    * [place] [facility], MD - Primary  ASSISTANT: None  PROCEDURE:  Dilation, hysteroscopy, myosure polypectomy, uterine curettage.   ANESTHESIA: General and paracervical block  ESTIMATED BLOOD LOSS: 73mL  DRAINS: I/O cath done pre op  TOTAL IV FLUIDS: per anesthesia note  SPECIMENS: Endometrial curettings and endocervical polyp  VTE PROPHYLAXIS: SCDs to the bilateral lower extremities  ANTIBIOTICS: Not indicated  COMPLICATIONS: none  DISPOSITION: PACU - hemodynamically stable.  FLUID DEFICIT:  CONDITION: stable  FINDINGS: Exam under anesthesia revealed 8wk sized, small, mobile uterus. On speculum exam, normal vagina and cervix with large polyp seen: large, friable, polyp seen coming out of the external os. It was coming out the external os about 2cm and it was 2cm wide.  On hysteroscopy, it's base was approximately 2 cm past the internal os on the right, posterior side, so overall length about 4-5cm.  Atrophic uterine cavity and bilateral ostia seen. All of polyp gone at the end of the procedure.    PROCEDURE IN DETAIL:  After informed consent was obtained, the patient was taken to the operating room where anesthesia was obtained without difficulty. The patient was positioned in the dorsal lithotomy position in [place] stirrups. The patient was examined under anesthesia, with the above noted findings.  The bi-valved speculum was placed inside the patient's vagina, and the the anterior lip of the cervix was seen and grasped with the tenaculum.  A paracervical block was achieved with 6mL of 1% lidocaine.  A uterine sound was passed around the polyp so the canal was felt to likely to be already dilated. The hysteroscop was introduced around the polyp into the uterine cavity with the above noted findings. Starting distally, the polyp was  removed with the Myosure device.  At the end of the procedure, a gentle uterine curettage was done.   Excellent hemostasis was noted, and all instruments were removed, with excellent hemostasis noted throughout.  She was then taken out of dorsal lithotomy. The patient tolerated the procedure well.  Sponge, lap and instrument counts were correct x2.  The patient was taken to recovery room in excellent condition.  Cornelia Copa MD Attending Center for [employer] [facility])

## 2019-03-15 NOTE — Anesthesia Postprocedure Evaluation (Signed)
Anesthesia Post Note  Patient: Kelly Prince  Procedure(s) Performed: DILATATION & CURETTAGE/HYSTEROSCOPY WITH MYOSURE - POLYPECTOMY (N/A Vagina )     Patient location during evaluation: PACU Anesthesia Type: General Level of consciousness: awake and alert and oriented Pain management: pain level controlled Vital Signs Assessment: post-procedure vital signs reviewed and stable Respiratory status: spontaneous breathing, nonlabored ventilation and respiratory function stable Cardiovascular status: blood pressure returned to baseline Postop Assessment: no apparent nausea or vomiting Anesthetic complications: no    Last Vitals:  Vitals:   03/15/19 1054 03/15/19 1100  BP:  121/64  Pulse: 64 67  Resp: 17 18  Temp:  36.5 C  SpO2: 100% 100%    Last Pain:  Vitals:   03/15/19 1100  TempSrc:   PainSc: 0-No pain                 Kaylyn Layer

## 2019-03-15 NOTE — H&P (Signed)
Obstetrics & Gynecology Surgical H&P   Date of Surgery: 03/15/2019   Primary OBGYN: [facility] Primary Care Provider: Jonah Blue MD  Reason for Admission: scheduled surg  History of Present Illness: Ms. Braggs is a 54 y.o. G2P2002 (No LMP recorded (lmp unknown). Patient is postmenopausal.), with the above CC. PMHx is significant for HTN, being postmenopausal, HPV positive but 16/18/45 negative.   Patient went to ED on 8/26 for VB. Patient states it started about a week prior and LMP was 7 years prior to this with no bleeding in between.  In the ED they noted something on the cervix and she had a negative cmp, cbc, beta hcg and gc/ct.   No pain and no bleeding every day. Patient initially seen by me on 10/7 and bx done which was negative. U/s obtained and large polyp seen so decision made to proceed with removal in the OR  Today the patient is asymptomatic.     ROS: A 12-point review of systems was performed and negative, except as stated in the above HPI.  OBGYN History: As per HPI. OB History  Gravida Para Term Preterm AB Living  2 2 2     2   SAB TAB Ectopic Multiple Live Births               # Outcome Date GA Lbr Len/2nd Weight Sex Delivery Anes PTL Lv  2 Term           1 Term             Obstetric Comments  SVD x 2     Past Medical History: Past Medical History:  Diagnosis Date  . GERD (gastroesophageal reflux disease)   . Hypertension     Past Surgical History: Past Surgical History:  Procedure Laterality Date  . BREAST BIOPSY Right   . CHOLECYSTECTOMY  09/2016    Family History:  Family History  Problem Relation Age of Onset  . Hypertension Mother   . Thyroid disease Mother     Social History:  Social History   Socioeconomic History  . Marital status: Single    Spouse name: Not on file  . Number of children: 2  . Years of education: BA -accounting  . Highest education level: Not on file  Occupational History  .  Occupation: unemployed  Tobacco Use  . Smoking status: Never Smoker  . Smokeless tobacco: Never Used  Substance and Sexual Activity  . Alcohol use: Never  . Drug use: Never  . Sexual activity: Not Currently  Other Topics Concern  . Not on file  Social History Narrative  . Not on file   Social Determinants of Health   Financial Resource Strain:   . Difficulty of Paying Living Expenses: Not on file  Food Insecurity:   . Worried About 10/2016 in the Last Year: Not on file  . Ran Out of Food in the Last Year: Not on file  Transportation Needs:   . Lack of Transportation (Medical): Not on file  . Lack of Transportation (Non-Medical): Not on file  Physical Activity:   . Days of Exercise per Week: Not on file  . Minutes of Exercise per Session: Not on file  Stress:   . Feeling of Stress : Not on file  Social Connections:   . Frequency of Communication with Friends and Family: Not on file  . Frequency of Social Gatherings with Friends and Family: Not on file  . Attends Religious  Services: Not on file  . Active Member of Clubs or Organizations: Not on file  . Attends Archivist Meetings: Not on file  . Marital Status: Not on file  Intimate Partner Violence:   . Fear of Current or Ex-Partner: Not on file  . Emotionally Abused: Not on file  . Physically Abused: Not on file  . Sexually Abused: Not on file    Allergy: No Known Allergies  Current Outpatient Medications: Medications Prior to Admission  Medication Sig Dispense Refill Last Dose  . amLODipine (NORVASC) 5 MG tablet Take 1 tablet (5 mg total) by mouth daily. 30 tablet 3 03/15/2019 at Unknown time  . Ascorbic Acid (VITAMIN C) 100 MG tablet Take by mouth daily. Pt isnt sure of dosage   03/10/2019 at Unknown time  . cholecalciferol (VITAMIN D3) 25 MCG (1000 UNIT) tablet Take by mouth daily. Pt unsure of dosage   03/10/2019 at Unknown time  . lisinopril (ZESTRIL) 20 MG tablet Take 1 tablet (20 mg total)  by mouth daily. 30 tablet 3 03/14/2019 at Unknown time  . vitamin E 180 MG (400 UNITS) capsule Take by mouth daily. Patient isnt sure of dosage   03/10/2019 at Unknown time     Hospital Medications: Current Facility-Administered Medications  Medication Dose Route Frequency Provider Last Rate Last Admin  . fentaNYL (SUBLIMAZE) injection 50 mcg  50 mcg Intravenous Q5 min PRN Lynda Rainwater, MD      . lactated ringers infusion   Intravenous Continuous Aletha Halim, MD 100 mL/hr at 03/15/19 0857 New Bag at 03/15/19 0857  . midazolam (VERSED) injection 1-2 mg  1-2 mg Intravenous Q5 min PRN Lynda Rainwater, MD         Physical Exam: Patient Vitals for the past 24 hrs:  BP Temp Temp src Pulse Resp SpO2 Height Weight  03/15/19 0837 (!) (P) 148/78 (!) (P) 97.5 F (36.4 C) (P) Tympanic (P) 73 (P) 20 (P) 100 % -- --  03/15/19 0833 -- -- -- -- -- -- 5\' 2"  (1.575 m) 86.5 kg    Body mass index is 34.88 kg/m. General appearance: Well nourished, well developed female in no acute distress.  Neck:  Supple, normal appearance, and no thyromegaly  Cardiovascular: S1, S2 normal, no murmur, rub or gallop, regular rate and rhythm Respiratory:  Clear to auscultation bilateral. Normal respiratory effort Abdomen: positive bowel sounds and no masses, hernias; diffusely non tender to palpation, non distended Neuro/Psych:  Normal mood and affect.  Skin:  Warm and dry.  Extremities: no clubbing, cyanosis, or edema.   From 10/7:  Pelvic exam: is not limited by body habitus EGBUS: within normal limits, mild atrophy Vagina: within normal limits, mild atrophy, and with scant old blood blood in the vault, no discharge Cervix: there is an approximately at least 3cm in length endocervical polyp x 1.5cm in width that extends into the canal (appears to move as if pedunculated), moderately friable. Surrounding cervix is normal. Uterus:  nonenlarged and non tender and Adnexa:  normal adnexa and no mass,  fullness, tenderness Rectovaginal: deferred   Laboratory: Neg: covid  Imaging:  CLINICAL DATA:  Postmenopausal bleeding  EXAM: TRANSABDOMINAL AND TRANSVAGINAL ULTRASOUND OF PELVIS  TECHNIQUE: Both transabdominal and transvaginal ultrasound examinations of the pelvis were performed. Transabdominal technique was performed for global imaging of the pelvis including uterus, ovaries, adnexal regions, and pelvic cul-de-sac. It was necessary to proceed with endovaginal exam following the transabdominal exam to visualize the uterus, endometrium, ovaries and  adnexa.  COMPARISON:  None  FINDINGS: Uterus  Measurements: 7.9 x 3.4 x 4.2 cm = volume: 59 mL. No fibroids or other mass visualized.  Endometrium  Thickness: 9 mm in thickness. Concern for focal endometrial lesion measuring 3.4 x 2.0 x 0.9 cm. Possible separate elongated endometrial lesion in the lower uterine segment/endocervical region measuring 1.9 x 1.0 x 1.3 cm.  Right ovary  Measurements: 2.2 x 1.0 x 1.3 cm = volume: 1.4 mL. Normal appearance/no adnexal mass.  Left ovary  Measurements: 1.9 x 1.0 x 1.1 cm = volume: 1.1 mL. Normal appearance/no adnexal mass.  Other findings  No abnormal free fluid.  IMPRESSION: Mildly thickened endometrium for postmenopausal state. Areas of concern for possible focal endometrial lesions. In the setting of post-menopausal bleeding, endometrial sampling is indicated to exclude carcinoma. If results are benign, sonohysterogram should be considered for focal lesion work-up prior to hysteroscopy. (Ref: Radiological Reasoning: Algorithmic Workup of Abnormal Vaginal Bleeding with Endovaginal Sonography and Sonohysterography. AJR 2008; 660:Y30-16)   Electronically Signed   By: Charlett Nose M.D.   On: 12/07/2018 16:20  Assessment: Ms. Mokry is a 54 y.o. [id number] with large polyp. Pt stable  Plan: Pt consented for hysteroscopy, d&c, polypectomy  Interpreter  used   Cornelia Copa MD Attending Center for [facility])

## 2019-03-15 NOTE — Transfer of Care (Signed)
Immediate Anesthesia Transfer of Care Note  Patient: Kelly Prince  Procedure(s) Performed: DILATATION & CURETTAGE/HYSTEROSCOPY WITH MYOSURE - POLYPECTOMY (N/A Vagina )  Patient Location: PACU  Anesthesia Type:General  Level of Consciousness: awake, alert  and oriented  Airway & Oxygen Therapy: Patient Spontanous Breathing and Patient connected to face mask oxygen  Post-op Assessment: Report given to RN and Post -op Vital signs reviewed and stable  Post vital signs: Reviewed and stable  Last Vitals:  Vitals Value Taken Time  BP 127/69 03/15/19 1006  Temp    Pulse 92 03/15/19 1007  Resp 15 03/15/19 1007  SpO2 100 % 03/15/19 1007  Vitals shown include unvalidated device data.  Last Pain:  Vitals:   03/15/19 0837  TempSrc: (P) Tympanic  PainSc:       Patients Stated Pain Goal: 6 (03/15/19 8127)  Complications: No apparent anesthesia complications

## 2019-03-15 NOTE — Discharge Instructions (Addendum)
Dilatacin y curetaje o curetaje por aspiracin, cuidados posteriores Dilation and Curettage or Vacuum Curettage, Care After Estas indicaciones le proporcionan informacin acerca de cmo deber cuidarse despus del procedimiento. El mdico tambin podr darle indicaciones ms especficas. Comunquese con el mdico si tiene algn problema o tiene preguntas despus del procedimiento. Siga estas indicaciones en su casa: Actividad  No conduzca ni use maquinaria pesada mientras toma analgsicos recetados.  Evite conducir en las primeras 24horas despus del procedimiento.  Haga caminatas breves con frecuencia, seguidas de perodos de descanso. Pregntele al mdico qu actividades son seguras para usted. Despus de [employer], quizs pueda retomar sus actividades normales.  No levante ningn objeto que pese ms de 10libras (4,5kg) hasta que el mdico lo apruebe.  Por al menos 2semanas o el tiempo que le haya indicado el mdico: ? No se haga duchas vaginales. ? No use tampones. ? No tenga relaciones sexuales. Instrucciones generales   [employer] de venta libre y los recetados solamente como se lo haya indicado el mdico. Esto es muy importante si toma anticoagulantes.  No se d baos de inmersin, no practique natacin ni use el jacuzzi hasta que el mdico lo apruebe. [employer] Museum/gallery curator de baos.  Use medias de compresin como se lo haya indicado el mdico.  Es su responsabilidad retirar los [place] del procedimiento. Pregntele al mdico la fecha en que los resultados estarn disponibles.  Concurra a todas las visitas de control como se lo haya indicado el mdico. Esto es importante. Comunquese con un mdico si:  Tiene clicos muy fuertes que empeoran o no mejoran con los medicamentos.  Tiene dolor muy intenso en el vientre (abdomen).  No puede beber lquidos sin vomitar.  Tiene dolor en otra parte del rea entre el vientre y los muslos (pelvis).  Tiene  secrecin vaginal con mal olor.  Tiene una erupcin cutnea. Solicite ayuda de inmediato si:  Tiene mucho sangrado de la vagina. Mucho sangrado significa empapar ms de una toalla higinica en una hora durante 2horas consecutivas.  Elimina grumos de sangre cogulos de sangre por la vagina.  Tiene fiebre o siente escalofros.  Siente dureza o dolor a Building services engineer.  Siente dolor en el pecho.  Tiene dificultad para respirar.  Tose y escupe sangre.  Siente mareos.  Tiene sensacin de desvanecimiento.  Pierde el conocimiento (se desmaya).  Siente dolor en la zona del cuello o los hombros. Resumen  Haga caminatas breves con frecuencia, seguidas de perodos de descanso. Pregntele al mdico qu actividades son seguras para usted. Despus de [employer], quizs pueda retomar sus actividades normales.  No levante ningn objeto que pese ms de 10libras (4,5kg) hasta que el mdico lo apruebe.  No se d baos de inmersin, no practique natacin ni use el jacuzzi hasta que el mdico lo apruebe. [employer] Museum/gallery curator de baos.  Comunquese con el mdico si tiene sntomas de infeccin, como secrecin vaginal con mal olor. Esta informacin no tiene Theme park manager el consejo del mdico. Asegrese de hacerle al mdico cualquier pregunta que tenga. Document Revised: 08/13/2016 Document Reviewed: 09/08/2012 Elsevier Patient Education  [employer].   We will discuss your surgery once again in detail at your post-op visit in two to four weeks. If you haven't already done so, please call to make your appointment as soon as possible.  Dilation and Curettage or Vacuum Curettage, Care After These instructions give you information on caring for yourself after  your procedure. Your doctor may also give you more specific instructions. Call your doctor if you have any problems or questions after your procedure. HOME CARE  Do not drive for 24 hours.  Wait 1 week before  doing any activities that wear you out.  Do not stand for a long time.  Limit stair climbing to once or twice a day.  Rest often.  Continue with your usual diet.  Drink enough fluids to keep your pee (urine) clear or pale yellow.  If you have a hard time pooping (constipation), you may:  Take a medicine to help you go poop (laxative) as told by your doctor.  Eat more fruit and bran.  Drink more fluids.  Take showers, not baths, for as long as told by your doctor.  Do not swim or use a hot tub until your doctor says it is okay.  Have someone with you for 1day after the procedure.  Do not douche, use tampons, or have sex (intercourse) until seen by your doctor  Only take medicines as told by your doctor. Do not take aspirin. It can cause bleeding.  Keep all doctor visits. GET HELP IF:  You have cramps or pain not helped by medicine.  You have new pain in the belly (abdomen).  You have a bad smelling fluid coming from your vagina.  You have a rash.  You have problems with any medicine. GET HELP RIGHT AWAY IF:   You start to bleed more than a regular period.  You have a fever.  You have chest pain.  You have trouble breathing.  You feel dizzy or feel like passing out (fainting).  You pass out.  You have pain in the tops of your shoulders.  You have vaginal bleeding with or without clumps of blood (blood clots). MAKE SURE YOU:  Understand these instructions.  Will watch your condition.  Will get help right away if you are not doing well or get worse. Document Released: 11/19/2007 Document Revised: 02/14/2013 Document Reviewed: 09/08/2012 [facility] Patient Information 2015 [place], [place]. This information is not intended to replace advice given to you by your health care provider. Make sure you discuss any questions you have with your health care provider.    Post Anesthesia Home Care Instructions  Activity: Get plenty of rest for the remainder of  the day. A responsible individual must stay with you for 24 hours following the procedure.  For the next 24 hours, DO NOT: -Drive a car -Paediatric nurse -Drink alcoholic beverages -Take any medication unless instructed by your physician -Make any legal decisions or sign important papers.  Meals: Start with liquid foods such as gelatin or soup. Progress to regular foods as tolerated. Avoid greasy, spicy, heavy foods. If nausea and/or vomiting occur, drink only clear liquids until the nausea and/or vomiting subsides. Call your physician if vomiting continues.  Special Instructions/Symptoms: Your throat may feel dry or sore from the anesthesia or the breathing tube placed in your throat during surgery. If this causes discomfort, gargle with warm salt water. The discomfort should disappear within 24 hours.  If you had a scopolamine patch placed behind your ear for the management of post- operative nausea and/or vomiting:  1. The medication in the patch is effective for 72 hours, after which it should be removed.  Wrap patch in a tissue and discard in the trash. Wash hands thoroughly with soap and water. 2. You may remove the patch earlier than 72 hours if you experience unpleasant  side effects which may include dry mouth, dizziness or visual disturbances. 3. Avoid touching the patch. Wash your hands with soap and water after contact with the patch.

## 2019-03-15 NOTE — Anesthesia Procedure Notes (Signed)
Procedure Name: LMA Insertion Date/Time: 03/15/2019 9:32 AM Performed by: Pearson Grippe, CRNA Pre-anesthesia Checklist: Patient identified, Emergency Drugs available, Suction available and Patient being monitored Patient Re-evaluated:Patient Re-evaluated prior to induction Oxygen Delivery Method: Circle system utilized Preoxygenation: Pre-oxygenation with 100% oxygen Induction Type: IV induction Ventilation: Mask ventilation without difficulty LMA: LMA inserted LMA Size: 4.0 Number of attempts: 1 Airway Equipment and Method: Bite block Placement Confirmation: positive ETCO2 Tube secured with: Tape Dental Injury: Teeth and Oropharynx as per pre-operative assessment

## 2019-03-15 NOTE — Anesthesia Preprocedure Evaluation (Addendum)
Anesthesia Evaluation  Patient identified by MRN, date of birth, ID band Patient awake    Reviewed: Allergy & Precautions, NPO status , Patient's Chart, lab work & pertinent test results  History of Anesthesia Complications Negative for: history of anesthetic complications  Airway Mallampati: II  TM Distance: >3 FB Neck ROM: Full    Dental  (+) Missing,    Pulmonary    Pulmonary exam normal        Cardiovascular hypertension, Pt. on medications Normal cardiovascular exam     Neuro/Psych    GI/Hepatic GERD  Controlled,  Endo/Other    Renal/GU      Musculoskeletal   Abdominal   Peds  Hematology   Anesthesia Other Findings   Reproductive/Obstetrics Endometrial polyp                            Anesthesia Physical Anesthesia Plan  ASA: II  Anesthesia Plan: General   Post-op Pain Management:    Induction: Intravenous  PONV Risk Score and Plan: 3 and Treatment may vary due to age or medical condition, Ondansetron, Dexamethasone and Midazolam  Airway Management Planned: LMA  Additional Equipment: None  Intra-op Plan:   Post-operative Plan: Extubation in OR  Informed Consent: I have reviewed the patients History and Physical, chart, labs and discussed the procedure including the risks, benefits and alternatives for the proposed anesthesia with the patient or authorized representative who has indicated his/her understanding and acceptance.     Dental advisory given  Plan Discussed with: CRNA  Anesthesia Plan Comments: (Spanish interpreter used for preop consent. )       Anesthesia Quick Evaluation

## 2019-03-16 ENCOUNTER — Encounter: Payer: Self-pay | Admitting: *Deleted

## 2019-03-16 LAB — SURGICAL PATHOLOGY

## 2019-03-28 ENCOUNTER — Ambulatory Visit: Payer: Self-pay | Admitting: Internal Medicine

## 2019-04-05 ENCOUNTER — Encounter: Payer: Self-pay | Admitting: Obstetrics and Gynecology

## 2019-04-05 ENCOUNTER — Ambulatory Visit (INDEPENDENT_AMBULATORY_CARE_PROVIDER_SITE_OTHER): Payer: Self-pay | Admitting: Obstetrics and Gynecology

## 2019-04-05 ENCOUNTER — Other Ambulatory Visit: Payer: Self-pay

## 2019-04-05 VITALS — BP 148/71 | HR 77 | Ht 63.0 in | Wt 192.0 lb

## 2019-04-05 DIAGNOSIS — Z09 Encounter for follow-up examination after completed treatment for conditions other than malignant neoplasm: Secondary | ICD-10-CM

## 2019-04-05 DIAGNOSIS — Z789 Other specified health status: Secondary | ICD-10-CM

## 2019-04-05 NOTE — Progress Notes (Signed)
[facility] 04/05/2019  CC: scheduled post op visit  Subjective:     Kelly Prince is a 54 y.o. s/p 1/20 hysteroscopy, myosure uterine polyp removal and d&c. Pt discharged from the pacu.  No bleeding or spotting since surgery. Pt feeling well.  Review of Systems Pertinent items are noted in HPI.    Objective:    BP (!) 148/71   Pulse 77   Ht 5\' 3"  (1.6 m)   Wt 192 lb (87.1 kg)   LMP  (LMP Unknown) Comment: about 8 years ago  BMI 34.01 kg/m  NAD EGBUS normal Vaginal vault normal, no d/c or blood Cervix normal Assessment:    Doing well postoperatively. Operative findings again reviewed. Pathology report discussed.    Plan:   D/w her that if she ever has any PMB to let know and that is not normal.   RTC PRN  [place] MD Attending Center for [facility] (Faculty Practice) 04/05/2019 Time: [id number]

## 2019-04-06 ENCOUNTER — Telehealth: Payer: Self-pay | Admitting: *Deleted

## 2019-04-06 NOTE — Telephone Encounter (Signed)
-----   Message from [name], MD sent at 04/06/2019 11:11 AM EST ----- Regarding: can you let the patient know I forgot to let the patient know yesterday that she needs a repeat pap smear at the end of this year b/c her pap was slightly abnormal last year.   Can you let her know? Thanks  You'll need an interpreter

## 2019-04-06 NOTE — Telephone Encounter (Signed)
I called Heiress with [employer] [phone] to her mobile number - a female answered and said he is her brother but she is not there. I asked if there was another number to reach her - he said he would take a message . I asked him to tell her we were calling from [facility] with information and to please call our office back.   I called her home number with interpreter and we left a message we were calling from [facility] with information from your provider, please call our office.  Aideen Fenster,RN

## 2019-04-07 NOTE — Telephone Encounter (Signed)
Called and pt was informed that she will need a pap smear at the end of this year.  Verbalized understanding.   Addison Naegeli, RN

## 2020-06-05 IMAGING — US US PELVIS COMPLETE WITH TRANSVAGINAL
1 series · 15 of 25 positions shown · non-contrast
Comparison: None

CLINICAL DATA: Postmenopausal bleeding



[Series 1: us pelvis complete with transvaginal · 81 acquisitions, 15 frames shown]
[im 1/81]
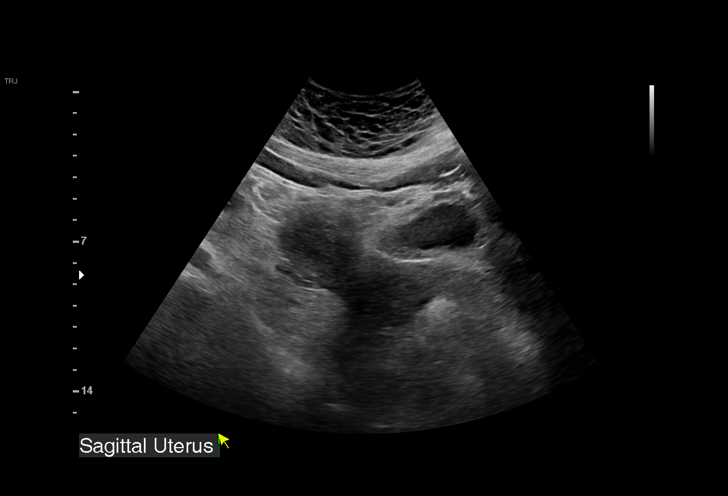
[im 7/81]
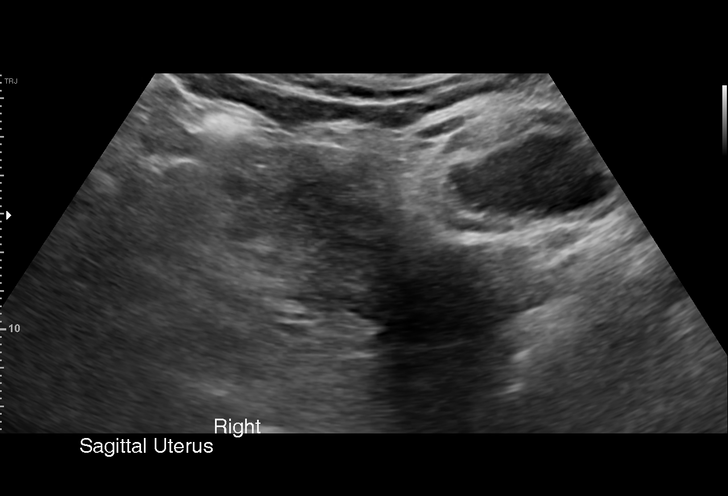
[im 14/81]
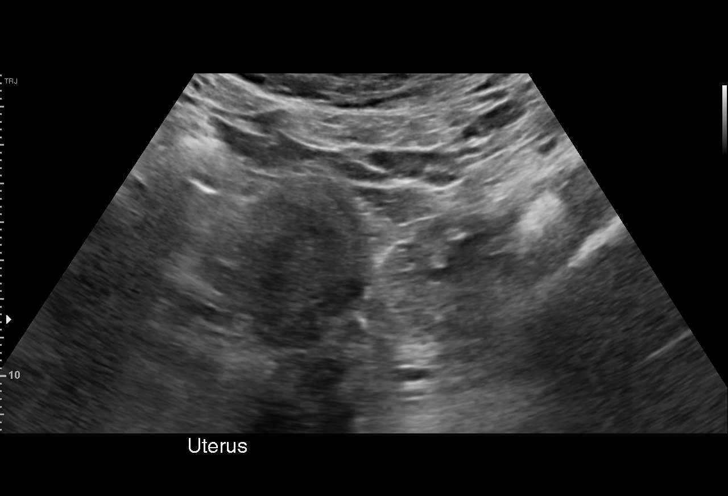
[im 17/81]
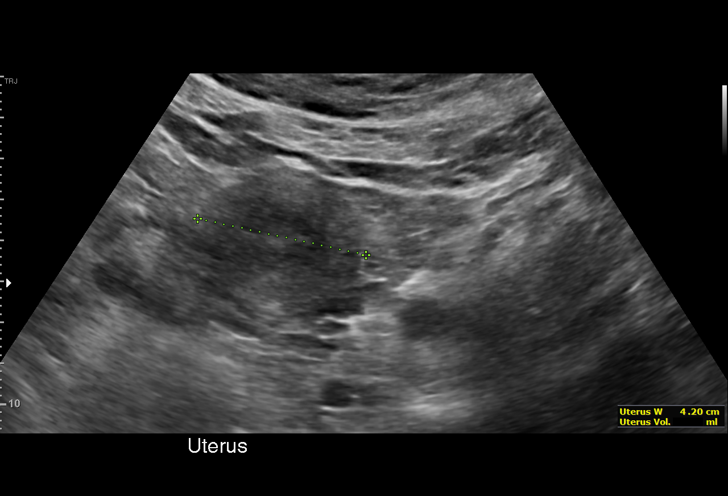
[im 24/81]
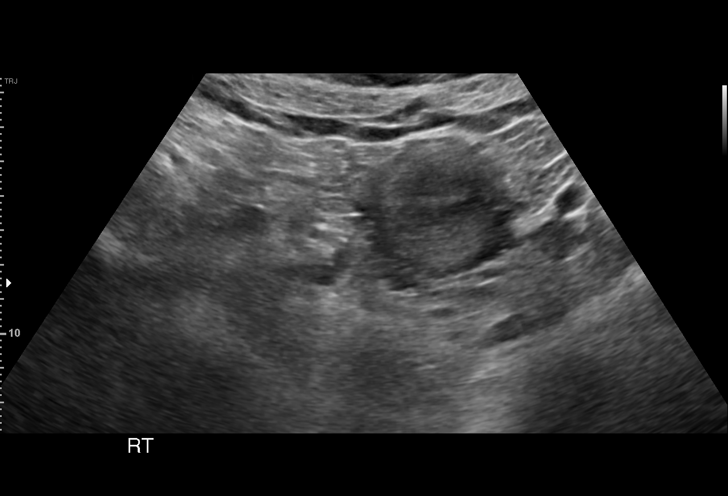
[im 31/81]
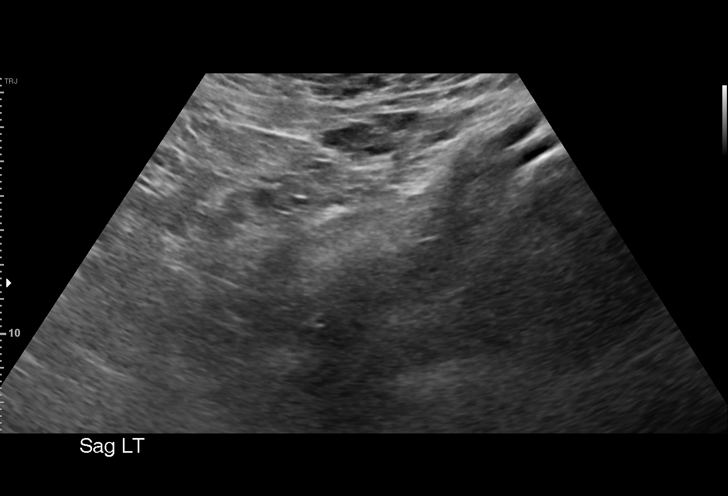
[im 34/81]
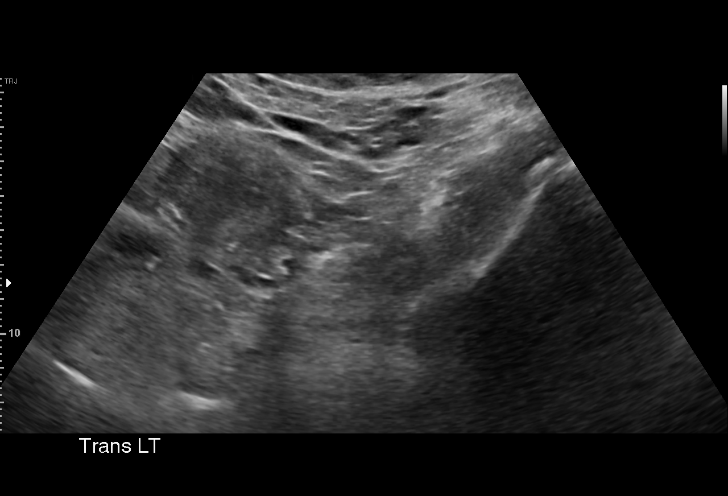
[im 41/81]
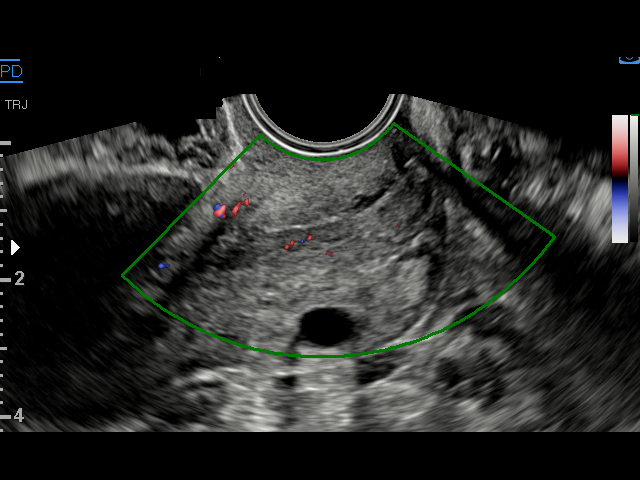
[im 47/81]
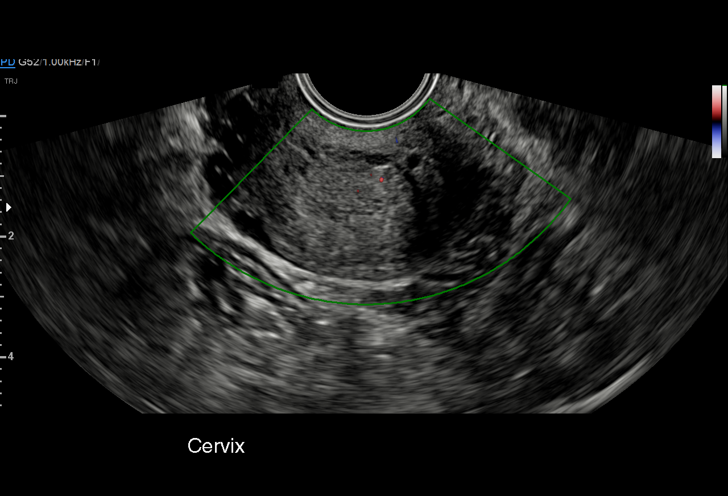
[im 51/81]
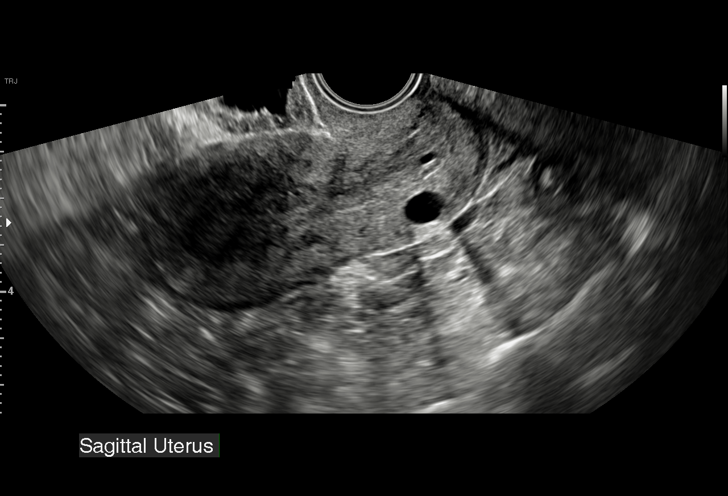
[im 57/81]
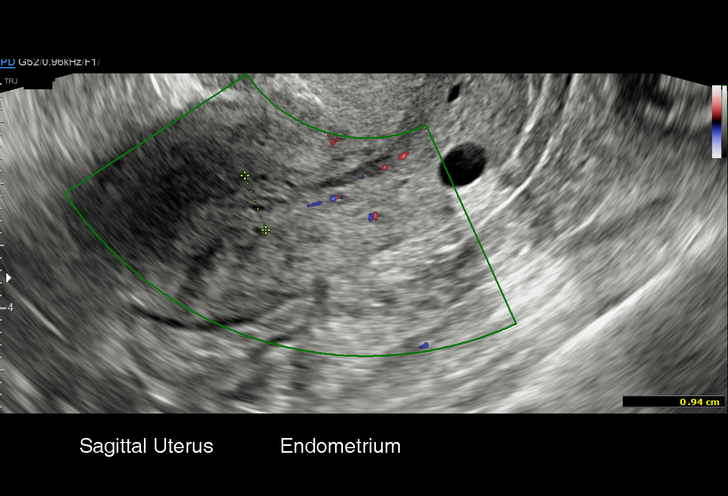
[im 64/81]
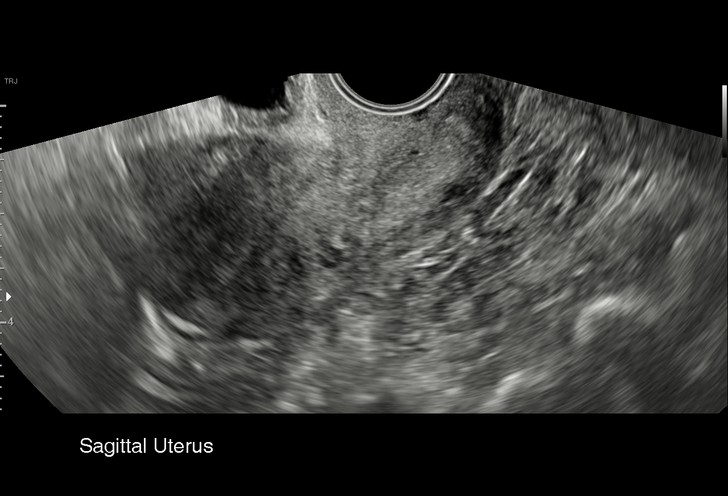
[im 67/81]
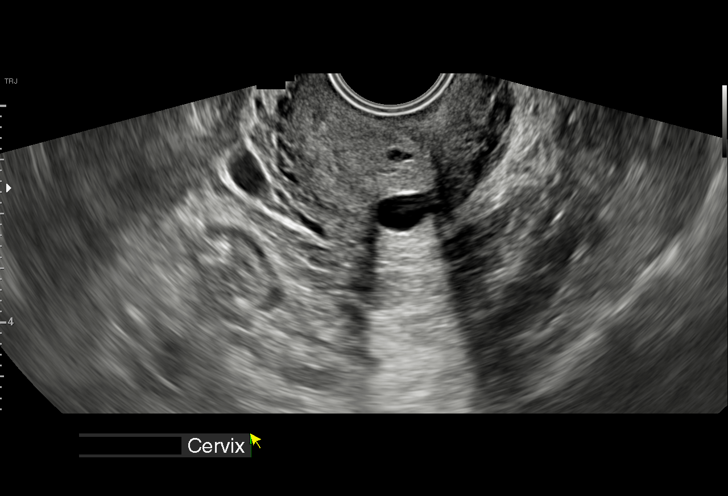
[im 74/81]
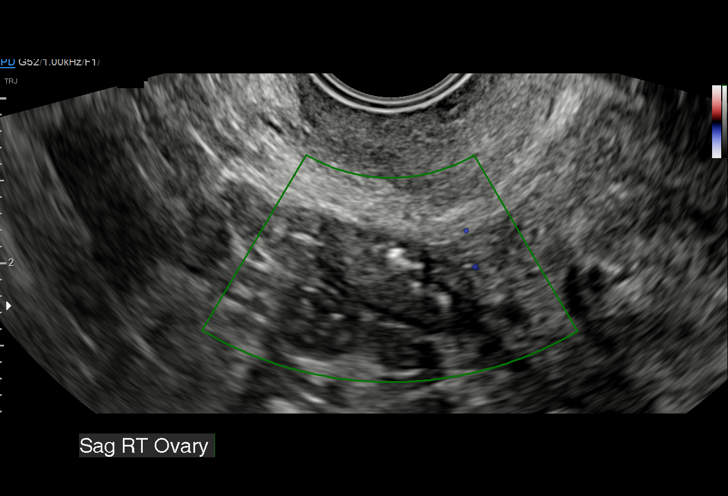
[im 81/81]
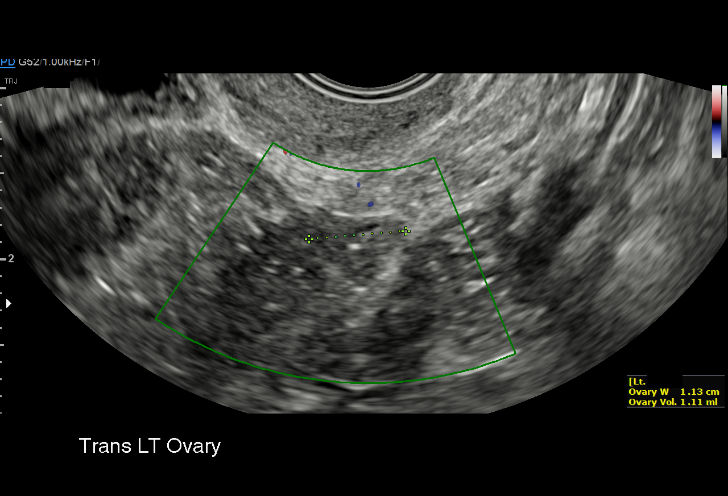

[15 of 25 positions shown; findings below may reference images not displayed]

FINDINGS: Uterus

Measurements: 7.9 x 3.4 x 4.2 cm = volume: 59 mL. No fibroids or
other mass visualized.

Endometrium

Thickness: 9 mm in thickness. Concern for focal endometrial lesion
measuring 3.4 x 2.0 x 0.9 cm. Possible separate elongated
endometrial lesion in the lower uterine segment/endocervical region
measuring 1.9 x 1.0 x 1.3 cm.

Right ovary

Measurements: 2.2 x 1.0 x 1.3 cm = volume: 1.4 mL. Normal
appearance/no adnexal mass.

Left ovary

Measurements: 1.9 x 1.0 x 1.1 cm = volume: 1.1 mL. Normal
appearance/no adnexal mass.

Other findings

No abnormal free fluid.
IMPRESSION: Mildly thickened endometrium for postmenopausal state. Areas of
concern for possible focal endometrial lesions. In the setting of
post-menopausal bleeding, endometrial sampling is indicated to
exclude carcinoma. If results are benign, sonohysterogram should be
considered for focal lesion work-up prior to hysteroscopy. (Ref:
Radiological Reasoning: Algorithmic Workup of Abnormal Vaginal
Bleeding with Endovaginal Sonography and Sonohysterography. AJR
0110; 191:S68-73)
